# Patient Record
Sex: Male | Born: 1965 | Race: White | Hispanic: No | Smoking: Never smoker
Health system: Southern US, Community
[De-identification: ages and names within clinical notes are randomized; demographics above are authoritative.]

## PROBLEM LIST (undated history)

## (undated) DIAGNOSIS — Z8249 Family history of ischemic heart disease and other diseases of the circulatory system: Secondary | ICD-10-CM

## (undated) DIAGNOSIS — I639 Cerebral infarction, unspecified: Secondary | ICD-10-CM

## (undated) DIAGNOSIS — E669 Obesity, unspecified: Secondary | ICD-10-CM

## (undated) DIAGNOSIS — E1165 Type 2 diabetes mellitus with hyperglycemia: Secondary | ICD-10-CM

## (undated) DIAGNOSIS — I10 Essential (primary) hypertension: Secondary | ICD-10-CM

## (undated) DIAGNOSIS — E1159 Type 2 diabetes mellitus with other circulatory complications: Secondary | ICD-10-CM

## (undated) DIAGNOSIS — N184 Chronic kidney disease, stage 4 (severe): Secondary | ICD-10-CM

## (undated) DIAGNOSIS — I482 Chronic atrial fibrillation, unspecified: Secondary | ICD-10-CM

## (undated) DIAGNOSIS — I2119 ST elevation (STEMI) myocardial infarction involving other coronary artery of inferior wall: Secondary | ICD-10-CM

## (undated) DIAGNOSIS — R600 Localized edema: Secondary | ICD-10-CM

## (undated) DIAGNOSIS — I2510 Atherosclerotic heart disease of native coronary artery without angina pectoris: Secondary | ICD-10-CM

## (undated) DIAGNOSIS — Z9861 Coronary angioplasty status: Secondary | ICD-10-CM

## (undated) DIAGNOSIS — G4733 Obstructive sleep apnea (adult) (pediatric): Secondary | ICD-10-CM

## (undated) DIAGNOSIS — E785 Hyperlipidemia, unspecified: Secondary | ICD-10-CM

## (undated) HISTORY — DX: Chronic atrial fibrillation, unspecified: I48.20

## (undated) HISTORY — DX: Localized edema: R60.0

## (undated) HISTORY — DX: Hyperlipidemia, unspecified: E78.5

## (undated) HISTORY — DX: ST elevation (STEMI) myocardial infarction involving other coronary artery of inferior wall: I21.19

## (undated) HISTORY — DX: Family history of ischemic heart disease and other diseases of the circulatory system: Z82.49

## (undated) HISTORY — DX: Atherosclerotic heart disease of native coronary artery without angina pectoris: I25.10

## (undated) HISTORY — DX: Type 2 diabetes mellitus with hyperglycemia: E11.65

## (undated) HISTORY — DX: Coronary angioplasty status: Z98.61

## (undated) HISTORY — DX: Essential (primary) hypertension: I10

## (undated) HISTORY — DX: Type 2 diabetes mellitus with other circulatory complications: E11.59

---

## 2005-06-17 LAB — CBC AND DIFFERENTIAL: WBC: 8.2 10^3/mL

## 2005-06-17 LAB — BASIC METABOLIC PANEL
Creatinine: 1.8 mg/dL — AB (ref 0.6–1.3)
Glucose: 100 mg/dL
Sodium: 141 mmol/L (ref 137–147)

## 2014-04-06 DIAGNOSIS — I2119 ST elevation (STEMI) myocardial infarction involving other coronary artery of inferior wall: Secondary | ICD-10-CM

## 2014-04-06 DIAGNOSIS — Z9861 Coronary angioplasty status: Secondary | ICD-10-CM

## 2014-04-06 DIAGNOSIS — I2510 Atherosclerotic heart disease of native coronary artery without angina pectoris: Secondary | ICD-10-CM

## 2014-04-06 HISTORY — DX: Atherosclerotic heart disease of native coronary artery without angina pectoris: I25.10

## 2014-04-06 HISTORY — PX: TRANSTHORACIC ECHOCARDIOGRAM: SHX275

## 2014-04-06 HISTORY — DX: ST elevation (STEMI) myocardial infarction involving other coronary artery of inferior wall: I21.19

## 2014-04-06 HISTORY — DX: Coronary angioplasty status: Z98.61

## 2014-04-24 ENCOUNTER — Encounter: Payer: Self-pay | Admitting: Physician Assistant

## 2014-04-24 ENCOUNTER — Inpatient Hospital Stay (HOSPITAL_COMMUNITY)
Admission: EM | Admit: 2014-04-24 | Discharge: 2014-04-26 | DRG: 248 | Disposition: A | Discharge status: Home / Self Care | Payer: Medicare Other | Source: Other Acute Inpatient Hospital | Attending: Cardiology | Admitting: Cardiology

## 2014-04-24 ENCOUNTER — Other Ambulatory Visit: Payer: Self-pay

## 2014-04-24 ENCOUNTER — Encounter (HOSPITAL_COMMUNITY)
Admission: EM | Disposition: A | Discharge status: Home / Self Care | Payer: Medicare Other | Source: Other Acute Inpatient Hospital | Attending: Cardiology

## 2014-04-24 ENCOUNTER — Emergency Department: Payer: Self-pay | Admitting: Internal Medicine

## 2014-04-24 DIAGNOSIS — I2119 ST elevation (STEMI) myocardial infarction involving other coronary artery of inferior wall: Secondary | ICD-10-CM | POA: Diagnosis present

## 2014-04-24 DIAGNOSIS — I129 Hypertensive chronic kidney disease with stage 1 through stage 4 chronic kidney disease, or unspecified chronic kidney disease: Secondary | ICD-10-CM | POA: Diagnosis present

## 2014-04-24 DIAGNOSIS — E1310 Other specified diabetes mellitus with ketoacidosis without coma: Secondary | ICD-10-CM

## 2014-04-24 DIAGNOSIS — Z794 Long term (current) use of insulin: Secondary | ICD-10-CM | POA: Diagnosis not present

## 2014-04-24 DIAGNOSIS — I10 Essential (primary) hypertension: Secondary | ICD-10-CM | POA: Diagnosis present

## 2014-04-24 DIAGNOSIS — Z7901 Long term (current) use of anticoagulants: Secondary | ICD-10-CM | POA: Diagnosis not present

## 2014-04-24 DIAGNOSIS — Z8249 Family history of ischemic heart disease and other diseases of the circulatory system: Secondary | ICD-10-CM | POA: Diagnosis not present

## 2014-04-24 DIAGNOSIS — E872 Acidosis: Secondary | ICD-10-CM | POA: Diagnosis present

## 2014-04-24 DIAGNOSIS — I872 Venous insufficiency (chronic) (peripheral): Secondary | ICD-10-CM | POA: Diagnosis present

## 2014-04-24 DIAGNOSIS — Z885 Allergy status to narcotic agent status: Secondary | ICD-10-CM

## 2014-04-24 DIAGNOSIS — N179 Acute kidney failure, unspecified: Secondary | ICD-10-CM | POA: Diagnosis not present

## 2014-04-24 DIAGNOSIS — I482 Chronic atrial fibrillation, unspecified: Secondary | ICD-10-CM | POA: Diagnosis present

## 2014-04-24 DIAGNOSIS — Z9861 Coronary angioplasty status: Secondary | ICD-10-CM

## 2014-04-24 DIAGNOSIS — E785 Hyperlipidemia, unspecified: Secondary | ICD-10-CM | POA: Diagnosis present

## 2014-04-24 DIAGNOSIS — Z8673 Personal history of transient ischemic attack (TIA), and cerebral infarction without residual deficits: Secondary | ICD-10-CM

## 2014-04-24 DIAGNOSIS — Z6841 Body Mass Index (BMI) 40.0 and over, adult: Secondary | ICD-10-CM | POA: Diagnosis not present

## 2014-04-24 DIAGNOSIS — E1165 Type 2 diabetes mellitus with hyperglycemia: Secondary | ICD-10-CM | POA: Diagnosis present

## 2014-04-24 DIAGNOSIS — I2510 Atherosclerotic heart disease of native coronary artery without angina pectoris: Secondary | ICD-10-CM

## 2014-04-24 DIAGNOSIS — E1100 Type 2 diabetes mellitus with hyperosmolarity without nonketotic hyperglycemic-hyperosmolar coma (NKHHC): Secondary | ICD-10-CM | POA: Diagnosis present

## 2014-04-24 DIAGNOSIS — I878 Other specified disorders of veins: Secondary | ICD-10-CM | POA: Diagnosis present

## 2014-04-24 DIAGNOSIS — I69959 Hemiplegia and hemiparesis following unspecified cerebrovascular disease affecting unspecified side: Secondary | ICD-10-CM

## 2014-04-24 DIAGNOSIS — N184 Chronic kidney disease, stage 4 (severe): Secondary | ICD-10-CM | POA: Diagnosis present

## 2014-04-24 DIAGNOSIS — I2111 ST elevation (STEMI) myocardial infarction involving right coronary artery: Secondary | ICD-10-CM

## 2014-04-24 DIAGNOSIS — R600 Localized edema: Secondary | ICD-10-CM | POA: Diagnosis present

## 2014-04-24 DIAGNOSIS — E119 Type 2 diabetes mellitus without complications: Secondary | ICD-10-CM | POA: Insufficient documentation

## 2014-04-24 DIAGNOSIS — I480 Paroxysmal atrial fibrillation: Secondary | ICD-10-CM

## 2014-04-24 DIAGNOSIS — E871 Hypo-osmolality and hyponatremia: Secondary | ICD-10-CM | POA: Diagnosis present

## 2014-04-24 DIAGNOSIS — R079 Chest pain, unspecified: Secondary | ICD-10-CM | POA: Diagnosis present

## 2014-04-24 HISTORY — PX: PERCUTANEOUS CORONARY STENT INTERVENTION (PCI-S): SHX6016

## 2014-04-24 HISTORY — DX: Family history of ischemic heart disease and other diseases of the circulatory system: Z82.49

## 2014-04-24 HISTORY — DX: Obstructive sleep apnea (adult) (pediatric): G47.33

## 2014-04-24 HISTORY — DX: Localized edema: R60.0

## 2014-04-24 HISTORY — DX: Chronic kidney disease, stage 4 (severe): N18.4

## 2014-04-24 HISTORY — DX: Obesity, unspecified: E66.9

## 2014-04-24 HISTORY — DX: Cerebral infarction, unspecified: I63.9

## 2014-04-24 HISTORY — PX: LEFT HEART CATHETERIZATION WITH CORONARY ANGIOGRAM: SHX5451

## 2014-04-24 LAB — BASIC METABOLIC PANEL
Anion Gap: 11 (ref 7–16)
BUN: 35 mg/dL — ABNORMAL HIGH (ref 7–18)
Calcium, Total: 8 mg/dL — ABNORMAL LOW (ref 8.5–10.1)
Chloride: 96 mmol/L — ABNORMAL LOW (ref 98–107)
Co2: 22 mmol/L (ref 21–32)
Creatinine: 2.72 mg/dL — ABNORMAL HIGH (ref 0.60–1.30)
EGFR (African American): 33 — ABNORMAL LOW
EGFR (Non-African Amer.): 27 — ABNORMAL LOW
Glucose: 689 mg/dL (ref 65–99)
Osmolality: 300 (ref 275–301)
Potassium: 4.1 mmol/L (ref 3.5–5.1)
Sodium: 129 mmol/L — ABNORMAL LOW (ref 136–145)

## 2014-04-24 LAB — COMPREHENSIVE METABOLIC PANEL
ALT: 14 U/L (ref 0–53)
AST: 13 U/L (ref 0–37)
Albumin: 2.9 g/dL — ABNORMAL LOW (ref 3.5–5.2)
Alkaline Phosphatase: 205 U/L — ABNORMAL HIGH (ref 39–117)
Anion gap: 8 (ref 5–15)
BUN: 42 mg/dL — ABNORMAL HIGH (ref 6–23)
CO2: 25 mmol/L (ref 19–32)
Calcium: 8.4 mg/dL (ref 8.4–10.5)
Chloride: 94 mEq/L — ABNORMAL LOW (ref 96–112)
Creatinine, Ser: 2.56 mg/dL — ABNORMAL HIGH (ref 0.50–1.35)
GFR calc Af Amer: 33 mL/min — ABNORMAL LOW (ref >90)
GFR calc non Af Amer: 28 mL/min — ABNORMAL LOW (ref >90)
Glucose, Bld: 710 mg/dL (ref 70–99)
Potassium: 4.2 mmol/L (ref 3.5–5.1)
Sodium: 127 mmol/L — ABNORMAL LOW (ref 135–145)
Total Bilirubin: 0.7 mg/dL (ref 0.3–1.2)
Total Protein: 6.6 g/dL (ref 6.0–8.3)

## 2014-04-24 LAB — CK TOTAL AND CKMB (NOT AT ARMC)
CK, MB: 3.8 ng/mL (ref 0.3–4.0)
CK, Total: 66 U/L (ref 39–308)
CK-MB: 2.2 ng/mL (ref 0.5–3.6)
Relative Index: INVALID (ref 0.0–2.5)
Total CK: 68 U/L (ref 7–232)

## 2014-04-24 LAB — GLUCOSE, CAPILLARY
Glucose-Capillary: >600 mg/dL (ref 70–99)
Glucose-Capillary: 559 mg/dL (ref 70–99)
Glucose-Capillary: 578 mg/dL (ref 70–99)
Glucose-Capillary: 493 mg/dL — ABNORMAL HIGH (ref 70–99)
Glucose-Capillary: 497 mg/dL — ABNORMAL HIGH (ref 70–99)

## 2014-04-24 LAB — APTT
Activated PTT: 29.8 secs (ref 23.6–35.9)
aPTT: 37 seconds (ref 24–37)

## 2014-04-24 LAB — CBC
HCT: 39.5 % (ref 39.0–52.0)
Hemoglobin: 13 g/dL (ref 13.0–17.0)
MCH: 28.1 pg (ref 26.0–34.0)
MCHC: 32.9 g/dL (ref 30.0–36.0)
MCV: 85.3 fL (ref 78.0–100.0)
Platelets: 181 K/uL (ref 150–400)
RBC: 4.63 MIL/uL (ref 4.22–5.81)
RDW: 14.3 % (ref 11.5–15.5)
WBC: 11.6 K/uL — ABNORMAL HIGH (ref 4.0–10.5)

## 2014-04-24 LAB — CBC WITH DIFFERENTIAL/PLATELET
Basophil #: 0.1 10*3/uL (ref 0.0–0.1)
Basophil %: 0.9 %
Eosinophil #: 0.1 10*3/uL (ref 0.0–0.7)
Eosinophil %: 0.9 %
HCT: 41.8 % (ref 40.0–52.0)
HGB: 12.9 g/dL — ABNORMAL LOW (ref 13.0–18.0)
Lymphocyte #: 1 10*3/uL (ref 1.0–3.6)
Lymphocyte %: 7.6 %
MCH: 27.6 pg (ref 26.0–34.0)
MCHC: 31 g/dL — ABNORMAL LOW (ref 32.0–36.0)
MCV: 89 fL (ref 80–100)
Monocyte #: 0.9 x10 3/mm (ref 0.2–1.0)
Monocyte %: 6.8 %
Neutrophil #: 10.9 10*3/uL — ABNORMAL HIGH (ref 1.4–6.5)
Neutrophil %: 83.8 %
Platelet: 173 10*3/uL (ref 150–440)
RBC: 4.68 10*6/uL (ref 4.40–5.90)
RDW: 15.4 % — ABNORMAL HIGH (ref 11.5–14.5)
WBC: 13 10*3/uL — ABNORMAL HIGH (ref 3.8–10.6)

## 2014-04-24 LAB — LIPID PANEL
Cholesterol: 180 mg/dL (ref 0–200)
HDL: 30 mg/dL — ABNORMAL LOW (ref >39)
LDL Cholesterol: 112 mg/dL — ABNORMAL HIGH (ref 0–99)
Total CHOL/HDL Ratio: 6 RATIO
Triglycerides: 191 mg/dL — ABNORMAL HIGH (ref <150)
VLDL: 38 mg/dL (ref 0–40)

## 2014-04-24 LAB — PROTIME-INR
INR: 1
INR: 1.07 (ref 0.00–1.49)
Prothrombin Time: 13.1 secs (ref 11.5–14.7)
Prothrombin Time: 14 s (ref 11.6–15.2)

## 2014-04-24 LAB — POCT ACTIVATED CLOTTING TIME: Activated Clotting Time: 607 s

## 2014-04-24 LAB — MRSA PCR SCREENING: MRSA by PCR: POSITIVE — AB

## 2014-04-24 LAB — TROPONIN I
Troponin I: 0.32 ng/mL — ABNORMAL HIGH
Troponin-I: 0.46 ng/mL — ABNORMAL HIGH

## 2014-04-24 SURGERY — LEFT HEART CATHETERIZATION WITH CORONARY ANGIOGRAM

## 2014-04-24 MED ORDER — SODIUM CHLORIDE 0.9 % IJ SOLN
3.0000 mL | Freq: Two times a day (BID) | INTRAMUSCULAR | Status: DC
  Administered 2014-04-24 – 2014-04-26 (×4): 3 mL via INTRAVENOUS

## 2014-04-24 MED ORDER — SODIUM CHLORIDE 0.9 % IV SOLN: 250.0000 mL | INTRAVENOUS | Status: DC | PRN

## 2014-04-24 MED ORDER — METOPROLOL TARTRATE 25 MG PO TABS
25.0000 mg | ORAL_TABLET | Freq: Two times a day (BID) | ORAL | Status: DC
  Administered 2014-04-24: 25 mg via ORAL
  Filled 2014-04-24 (×3): qty 1

## 2014-04-24 MED ORDER — SODIUM CHLORIDE 0.9 % IV SOLN
INTRAVENOUS | Status: DC
  Administered 2014-04-24: 19:00:00 via INTRAVENOUS

## 2014-04-24 MED ORDER — INSULIN ASPART 100 UNIT/ML ~~LOC~~ SOLN
SUBCUTANEOUS | Status: AC
  Filled 2014-04-24: qty 1

## 2014-04-24 MED ORDER — CLOPIDOGREL BISULFATE 75 MG PO TABS
75.0000 mg | ORAL_TABLET | Freq: Every day | ORAL | Status: DC
  Administered 2014-04-25 – 2014-04-26 (×2): 75 mg via ORAL
  Filled 2014-04-24 (×3): qty 1

## 2014-04-24 MED ORDER — LIRAGLUTIDE 18 MG/3ML ~~LOC~~ SOPN: 1.8000 mg | PEN_INJECTOR | Freq: Every day | SUBCUTANEOUS | Status: DC

## 2014-04-24 MED ORDER — SODIUM CHLORIDE 0.9 % IV SOLN
0.2500 mg/kg/h | INTRAVENOUS | Status: DC
  Filled 2014-04-24: qty 250

## 2014-04-24 MED ORDER — MORPHINE SULFATE 2 MG/ML IJ SOLN: 2.0000 mg | INTRAMUSCULAR | Status: DC | PRN

## 2014-04-24 MED ORDER — HYDRALAZINE HCL 50 MG PO TABS
100.0000 mg | ORAL_TABLET | Freq: Two times a day (BID) | ORAL | Status: DC
  Administered 2014-04-24 – 2014-04-26 (×4): 100 mg via ORAL
  Filled 2014-04-24 (×4): qty 2

## 2014-04-24 MED ORDER — INSULIN ASPART 100 UNIT/ML ~~LOC~~ SOLN
0.0000 [IU] | Freq: Three times a day (TID) | SUBCUTANEOUS | Status: DC
Start: 2014-04-24 — End: 2014-04-24

## 2014-04-24 MED ORDER — FUROSEMIDE 80 MG PO TABS
80.0000 mg | ORAL_TABLET | Freq: Every day | ORAL | Status: DC
  Administered 2014-04-25 – 2014-04-26 (×2): 80 mg via ORAL
  Filled 2014-04-24 (×2): qty 1

## 2014-04-24 MED ORDER — ONDANSETRON HCL 4 MG/2ML IJ SOLN: 4.0000 mg | Freq: Four times a day (QID) | INTRAMUSCULAR | Status: DC | PRN

## 2014-04-24 MED ORDER — WARFARIN SODIUM 10 MG PO TABS
10.0000 mg | ORAL_TABLET | Freq: Every day | ORAL | Status: DC
  Filled 2014-04-24: qty 1

## 2014-04-24 MED ORDER — INSULIN ASPART 100 UNIT/ML ~~LOC~~ SOLN
0.0000 [IU] | SUBCUTANEOUS | Status: DC
  Administered 2014-04-24 – 2014-04-25 (×2): 20 [IU] via SUBCUTANEOUS
  Administered 2014-04-25 (×2): 7 [IU] via SUBCUTANEOUS

## 2014-04-24 MED ORDER — SODIUM CHLORIDE 0.9 % IJ SOLN: 3.0000 mL | INTRAMUSCULAR | Status: DC | PRN

## 2014-04-24 MED ORDER — GLIMEPIRIDE 4 MG PO TABS
4.0000 mg | ORAL_TABLET | Freq: Two times a day (BID) | ORAL | Status: DC
  Administered 2014-04-25: 4 mg via ORAL
  Filled 2014-04-24 (×3): qty 1

## 2014-04-24 MED ORDER — SPIRONOLACTONE 25 MG PO TABS
25.0000 mg | ORAL_TABLET | Freq: Every day | ORAL | Status: DC
  Administered 2014-04-25 – 2014-04-26 (×2): 25 mg via ORAL
  Filled 2014-04-24 (×2): qty 1

## 2014-04-24 MED ORDER — INSULIN GLARGINE 100 UNIT/ML ~~LOC~~ SOLN
25.0000 [IU] | Freq: Every day | SUBCUTANEOUS | Status: DC
  Administered 2014-04-24: 25 [IU] via SUBCUTANEOUS
  Filled 2014-04-24 (×2): qty 0.25

## 2014-04-24 MED ORDER — CHLORHEXIDINE GLUCONATE CLOTH 2 % EX PADS
6.0000 | MEDICATED_PAD | Freq: Every day | CUTANEOUS | Status: DC
Start: 2014-04-25 — End: 2014-04-26
  Administered 2014-04-25 – 2014-04-26 (×2): 6 via TOPICAL

## 2014-04-24 MED ORDER — ATORVASTATIN CALCIUM 80 MG PO TABS: 80.0000 mg | ORAL_TABLET | Freq: Every day | ORAL | Status: DC

## 2014-04-24 MED ORDER — HEPARIN SODIUM (PORCINE) 5000 UNIT/ML IJ SOLN
5000.0000 [IU] | Freq: Three times a day (TID) | INTRAMUSCULAR | Status: DC
  Administered 2014-04-25 – 2014-04-26 (×4): 5000 [IU] via SUBCUTANEOUS
  Filled 2014-04-24 (×4): qty 1

## 2014-04-24 MED ORDER — CITALOPRAM HYDROBROMIDE 20 MG PO TABS
40.0000 mg | ORAL_TABLET | Freq: Every day | ORAL | Status: DC
  Administered 2014-04-25 – 2014-04-26 (×2): 40 mg via ORAL
  Filled 2014-04-24: qty 2
  Filled 2014-04-24: qty 1

## 2014-04-24 MED ORDER — ATORVASTATIN CALCIUM 80 MG PO TABS
80.0000 mg | ORAL_TABLET | Freq: Every day | ORAL | Status: DC
  Administered 2014-04-25: 80 mg via ORAL
  Filled 2014-04-24 (×2): qty 1

## 2014-04-24 MED ORDER — LABETALOL HCL 200 MG PO TABS
200.0000 mg | ORAL_TABLET | Freq: Two times a day (BID) | ORAL | Status: DC
Start: 2014-04-24 — End: 2014-04-25
  Administered 2014-04-24: 200 mg via ORAL
  Filled 2014-04-24 (×3): qty 1

## 2014-04-24 MED ORDER — EZETIMIBE 10 MG PO TABS
10.0000 mg | ORAL_TABLET | Freq: Every day | ORAL | Status: DC
  Administered 2014-04-25 – 2014-04-26 (×2): 10 mg via ORAL
  Filled 2014-04-24 (×2): qty 1

## 2014-04-24 MED ORDER — MUPIROCIN 2 % EX OINT
1.0000 "application " | TOPICAL_OINTMENT | Freq: Two times a day (BID) | CUTANEOUS | Status: DC
  Administered 2014-04-24 – 2014-04-26 (×4): 1 via NASAL
  Filled 2014-04-24 (×2): qty 22

## 2014-04-24 MED ORDER — HEPARIN SODIUM (PORCINE) 5000 UNIT/ML IJ SOLN: 5000.0000 [IU] | Freq: Three times a day (TID) | INTRAMUSCULAR | Status: DC

## 2014-04-24 MED ORDER — ACETAMINOPHEN 325 MG PO TABS: 650.0000 mg | ORAL_TABLET | ORAL | Status: DC | PRN

## 2014-04-24 MED ORDER — ALLOPURINOL 300 MG PO TABS
300.0000 mg | ORAL_TABLET | Freq: Every day | ORAL | Status: DC
  Administered 2014-04-25 – 2014-04-26 (×2): 300 mg via ORAL
  Filled 2014-04-24 (×2): qty 1

## 2014-04-24 MED ORDER — WARFARIN - PHYSICIAN DOSING INPATIENT
Freq: Every day | Status: DC
  Administered 2014-04-25: 18:00:00

## 2014-04-24 NOTE — Interval H&P Note (Signed)
History and Physical Interval Note:  04/24/2014 5:45 PM  Chad Baker  has presented today for surgery, with the diagnosis of Inferior STEMI.  The various methods of treatment have been discussed with the patient and family. After consideration of risks, benefits and other options for treatment, the patient has consented to  Procedure(s): LEFT HEART CATHETERIZATION WITH CORONARY ANGIOGRAM (N/A) as a surgical intervention .  The patient's history has been reviewed, patient examined, no change in status, stable for surgery.  I have reviewed the patient's chart and labs.  Questions were answered to the patient's satisfaction.     HARDING, DAVID W

## 2014-04-24 NOTE — Progress Notes (Signed)
eLink Physician-Brief Progress Note Patient Name: Chad GaveMel Rainford DOB: [birth year] MRN: [id number]   Date of Service  04/24/2014  HPI/Events of Note  [age] M admitted directly from cardiac cath lab with dx of STEMI and PMH of CAF, Htn, DM, CKD, obesity. He is in no distress and vital signs are stable  eICU Interventions  Care plan reviewed.  Usual eLink monitoring will be provided     Intervention Category Evaluation Type: New Patient Evaluation  Billy FischerDavid Simonds 04/24/2014, 6:57 PM

## 2014-04-24 NOTE — H&P (Signed)
History and Physical  Patient ID: Chad Baker MRN: [id number], DOB: 12-13-1965 Date of Encounter: 04/24/2014, 3:26 PM Primary Physician: No primary care provider on file. Primary Cardiologist: New to the area - has not seen cardiologist in quite a while (originally from [place])  Chief Complaint: chest pain Reason for Admission: STEMI  HPI: Chad Baker is a 49 y/o M with history of morbid obesity, CVA 2000 (?hemorrhagic per patient), HTN, HLD, CKD (unknown baseline), DM, OSA, AF who presented to [facility] upon transfer from [facility] for STEMI. He moved here from [place] recently but said he has not seen a cardiologist for quite a while. He says he has a history of prior DCCV but since then has remained in AF chronically. His primary care doctor in [place] was following his INR but he has not had this checked since November (1.0 today). He was in his usual state of heath this morning, however, began feeling "terrible" around 11am. He said he had a BM all over himself and from there began having chest pain with nausea and mild diaphoresis. He was watching TV at the time. CP went to his jaw and he felt lightheaded and SOB. He took his regularly scheduled meds (unknown at this time) but did not feel better so called EMS. He was taken to [facility] where EKG demonstrated acute inferior ST elevation. Glucose >600, Cr 2.72, INR 1.0. He was subsequently transferred to [facility] for further evaluation. He received  ASA, 5000 units of heparin, 10 unts of insulin, 1 SL NTG and IV fluids (1.5 L per triage nurse). EMS states BP was 90/40 with HR 70s when they initially picked him up. Most recent vitals show BP 120/76, HR 70s. He says he feels much better right now but still notes 2/10 chest discomfort.   Past Medical History  Diagnosis Date  . Atrial fibrillation   . Stroke     a. 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  Marland Kitchen HTN (hypertension)   . Hyperlipidemia   . CKD (chronic kidney  disease)   . Diabetes mellitus   . OSA (obstructive sleep apnea)   . Morbid obesity      Most Recent Cardiac Studies: None   Surgical History: Prior DCCV per patient.  Home Meds: Unknown at this time. Patient said he asked EMS to grab them but it's unclear if they made the trip.  Allergies:  Allergies  Allergen Reactions  . Codeine Nausea And Vomiting    History   Social History  . Marital Status: Single    Spouse Name: N/A    Number of Children: N/A  . Years of Education: N/A   Occupational History  . Not on file.   Social History Main Topics  . Smoking status: Never Smoker   . Smokeless tobacco: Not on file  . Alcohol Use: No  . Drug Use: No  . Sexual Activity: Not on file   Other Topics Concern  . Not on file   Social History Narrative   Originally from [place], moved to [place]. Has girlfriend in [place].     Family History  Problem Relation Age of Onset  . Heart disease Father   . Heart disease Brother     Review of Systems: Chronic LEE with venous stasis changes. All other systems reviewed and are otherwise negative except as noted above.  Labs: WBC 13.0, Hgb 12.9, Hct 41.8, Plt 173 PT 13.1, INR 1.0 APTT 29.8  BUN 36, Cr 2.72, Na 129, K 4.10, Cl 96, CO2 22, Calcium 8.0, anion gap 11 POCT glucose >600  Radiology/Studies:  Our [facility] team read the report off - CXR - "Cardiac enlargement with mild vascular congestion, interstitial edema, no focal infiltrate. Findings c/w mild CHF."   EKG:  Initial EMS EKG with AF 57bpm, nonspecific ST-T changes with slight upsloping in lead III Subsequent trac[facility] ED: atrial fibrillation with rates 89-97 conerning for inferior ST Elevation up to II mm in lead III, less pronounced but still present in II, avF, with reciprocal downsloping TWI/ST depression I, avL  Physical Exam: BP 120/76, HR 76, POx 99% 2L, RR 12 General: Well developed morbidly obese WM, in no acute distress. Head:  Normocephalic, atraumatic, sclera non-icteric, no xanthomas, nares are without discharge.  Neck: Negative for carotid bruits.  Lungs: Clear bilaterally to auscultation without wheezes, rales, or rhonchi. Breathing is unlabored. Heart: RRR with S1 S2. No murmurs, rubs, or gallops appreciated. Abdomen: Soft, obese and rounded with normoactive bowel sounds. No hepatomegaly. No rebound/guarding. No obvious abdominal masses. Msk:  Strength and tone appear normal for age. Extremities: 2+ LEE and venous stasis discoloration which patient states is chronic. Pedal pulses difficult to palpate given habitus. Neuro: Alert and oriented X 3. No focal deficit. No facial asymmetry. Moves all extremities spontaneously. Psych:  Responds to questions appropriately with a normal affect.    ASSESSMENT AND PLAN:   1. Acute inferior STEMI 2. Chronic atrial fibrillation, supposed to be on Coumadin PTA 3. H/o stroke (possibly hemorrhagic) in 2010 4. Essential HTN 5. Hyperlipidemia 6. Diabetes mellitus with marked hyperglycemia and subsequent pseudohyponatremia 7. CKD, baseline Cr unknown - [facility] lists "stage IV" but patient unsure 8. Morbid obesity  The patient was brought emergently to the cardiac cath lab where he will undergo emergent cardiac catheterization. Further management dependent on anatomy. Cath lab team aware of AKI and history of stroke. Given history of possible hemorrhagic CVA (based on patient's report) he would likely only be a candidate for Plavix. We will also need to determine timing of resumption of anticoagulation since CHADSVASC = at least 4 for HTN/DM/CVA, possibly 5 if he does have vascular disease, and EF is also currently unknown. Appreciate pharmacy's assistance with home med rec. Per d/w MD, Dr. Herbie BaltimoreHarding plans to place admit orders once cath is complete and plan is known. Tentatively I will place SSI orders but have asked IM to please assist with marked hyperglycemia. The hospitalist has  asked me to write a care order asking the nurse to call the internal medicine flow manager at [phone] when the patient arrives to their CCU bed so that they can come and see him. I have done this. Appreciate their assistance.  Signed, Dayna Dunn PA-C 04/24/2014, 3:26 PM   I saw and examined the patient in the cardiac catheterization lab. He presented to [facility] room following chest pain onset this morning after large BM. He was just not feeling well and started having pain. In the ER was noted to have significant ST elevations in inferior leads in the baseline atrial fibrillation. He had 1-2 mm ablation in II, III, aVF with depressions in I and aVL.  He was also noted to have a blood glucose level of greater than 650. He is morbidly obese and was not able to be done at the [facility]. Therefore he was dosed with IV heparin and insulin and transferred here for urgent catheterization. Upon  arrival he was a 3 out of 10 chest pain, therefore we've decided to proceed with emergent cardiac catheterization. Was also noted to have elevated creatinine of greater than 2 with known chronic renal insufficiency. Unfortunately we don't know what his actual baseline is.  No LV Gram will be performed in order to conserve contrast to protect renal function.  Following catheterization, he will need medicine consultation to assist with glycemic control. He is on warfarin long term, therefore if possible would try to use bare-metal stents to avoid prolonged multiple drug therapy.  He will need to be on statin. We'll need to wait to see what his blood pressures are following his catheterization prior to initiating other therapy beyond beta blocker.  Marykay Lex, M.D., M.S. Interventional Cardiologist   Pager # [phone]

## 2014-04-24 NOTE — Progress Notes (Signed)
PHARMACIST - PHYSICIAN COMMUNICATION Drs. Diana EvesHarding and Khan CONCERNING: Pharmacy Care Issues Regarding Warfarin Labs  RECOMMENDATION (Action Taken): A daily protime has been ordered to meet the [facility] National Patient safety goal and comply with the current [facility] policy.   The Pharmacy will defer all warfarin dose order changes and follow up of lab results to the prescriber unless an additional order to initiate a "pharmacy Coumadin consult" is placed.  DESCRIPTION:  While hospitalized, to be in compliance with The Joint Commission National Patient Safety Goals, all patients on warfarin must have a baseline and/or current protime prior to the administration of warfarin. Pharmacy has received your order for warfarin without these required laboratory assessments.   Nicolette Bangheresa Dianah Pruett, RPh Pager: [phone] 04/24/2014 9:50 PM

## 2014-04-24 NOTE — CV Procedure (Signed)
CARDIAC CATHETERIZATION AND PERCUTANEOUS CORONARY INTERVENTION REPORT  NAME:  Chad Baker   MRN: [id number] DOB:  07/10/1965   ADMIT DATE: 04/24/2014 Procedure Date: 04/24/2014  INTERVENTIONAL CARDIOLOGIST: Leonie Man, M.D., MS PRIMARY CARE PROVIDER: No primary care provider on file. PRIMARY CARDIOLOGIST:  New to    PATIENT:  Chad Baker is a 49 y.o. male with severe morbid obesity, prior CVA (with hemorrhagic component) in 2000, hypertension, hyperlipidemia, chronic renal insufficiency with unknown baseline, type 2 diabetes mellitus poorly controlled, OSA, chronic atrial fibrillation supposedly on warfarin. He was transferred from [facility] or inferior STEMI after presenting to the emergency room there and found to have inferior ST elevations in leads II, III, aVF with appropriate reciprocal changes. He was also noted to be hyperglycemic with a blood sugar of greater than 600. Creatinine was 2.72. Subtherapeutic INR 1.0. Due to his morbid obesity, he was too heavy for the [facility] cardiac catheterization lab, therefore he was transferred to [facility] for urgent cardiac catheterization.  PRE-OPERATIVE DIAGNOSIS:    Inferior STEMI  Extensive cardiac risk factors as indicated above.  PROCEDURES PERFORMED:    Left Heart Catheterization with Native Coronary Angiography  via Right Radial Artery   PTCA of proximal RCA 100% occlusion  Aspiration Thrombectomy of proximal to distal RCA  Complex, difficult 2 site Bare-Metal Stent PCI to the distal and proximal RCA  PROCEDURE: The patient was brought to the 2nd [facility]iac Catheterization Lab in the fasting state and prepped and draped in the usual sterile fashion for right radial artery access. A modified Allen's test was performed on the right wrist demonstrating excellent collateral flow for radial access.   Sterile technique was used including antiseptics, cap, gloves, gown, hand hygiene, mask and sheet.  Skin prep: Chlorhexidine.   Consent: Risks of procedure as well as the alternatives and risks of each were explained to the (patient/caregiver). Consent for procedure obtained.   Time Out: Verified patient identification, verified procedure, site/side was marked, verified correct patient position, special equipment/implants available, medications/allergies/relevent history reviewed, required imaging and test results available. Performed.  Access:   Right Radial Artery: 6 Fr Sheath -  Seldinger Technique (Angiocath Micropuncture Kit)  Radial Cocktail - 10 mL; IV Angiomax bolus and drip   Left Heart Catheterization: 5 and 6 Fr Catheters advanced or exchanged over a long exchange safety J-wire; TIG 4.0 catheter advanced first.  Left Coronary Artery Cineangiography: TIG 4.0 Catheter  Right Coronary Artery Cineangiography: 6 Fr JR4 Guide Catheter   LV Hemodynamics: Angled Pigtail, Post PCI  Sheath removed in the cardiac catheterization lab with TR band placement for hemostasis.  TR Band: 1730  Hours; 17 mL air  FINDINGS:  Hemodynamics:   Central Aortic Pressure / Mean: 135/95/110 mmHg  Left Ventricular Pressure / LVEDP: 137/13/23 mmHg  Left Ventriculography: Not performed  Coronary Anatomy:  Dominance: Right  Left Main: Very Large-caliber vessel that trifurcates into the LAD, Ramus Intermedius, Circumflex. Angiographic normal. LAD: Normal caliber vessel with diffuse mild tender 20% lesions. Vessel courses down around the apex perfusing the distal one third of the inferoapex.  D1: Moderate large-caliber proximal diagonal branch that bifurcates. It is also relatively free of disease.   Left Circumflex: Very Large-caliber vessel with a very small first marginal branch in the mid AV groove. Just prior to that there is a focal 20% stenosis. The vessel then really terminates as a large lateral OM with a very small AV groove branch. Tortuous vessel  but free of significant disease. Her main  is large caliber almost all the way to the distal end.  Ramus intermedius: Normal/large-caliber vessel that bifurcates into 2 branches, one that courses as a proximal diagonal and the another branch that courses as an obtuse marginal. No obvious lesions noted.   RCA: Extremely large caliber, dominant vessel that is occluded at the proximal segment just after the first bend. There is a bird's beak appearance. There is calcification noted in the mid vessel.  Post wire advancement revealed a large caliber vessel continuing on distally where bifurcates into the Right Posterior AV Groove Branch (RPAV) and the Right Posterior Descending Artery (RPDA). At the crux there is a focal 80% hazy lesion. This was actually identified following balloon angioplasty site where distal thromboembolism occluded the vessel.  RPDA: Moderate large-caliber tortuous vessel with minimal disease.  RPL Sysytem:The RPAV begins as a moderate-caliber vessel that really terminates as a moderate caliber posterolateral branch that appears to be occluded distally in area to far to advance wire.  After reviewing the initial angiography, the culprit lesion was thought to be 100% thrombotic lesion occluded proximal RCA.  Preparation were made to proceed with PCI on this lesion.  Percutaneous Coronary Intervention:  100% thrombotic proximal RCA occlusion with distal embolization - initial TIMI 0 flow with TIMI-3 flow post; 2 stenoses (100% and 80%) reduced to 0%  Complex procedure secondary to tortuosity of the vessel, long size of the vessel requiring cineangiography for simple visualization. All wires were used including the GuideLiner catheter Guide: 6 Fr   JR4  Guidewire: Initial wire was a [employer] -- there was reperfusion with wire advancement beyond the lesion.  After balloon angioplasty, the initial stent was unable to be deployed distally, therefore a GuideLiner catheter was used, but still the stent would not deploy a beyond the  distal lesion. At this point while removing the stent, the wire and guide liner were also removed.  The lesion was then rewired with first a BMW followed by a [employer] wire that would be used as the [employer] Balloon: Emerge 3.0 mm x 15 mm; over the initial BMW wire  12 Atm x 30 Sec,   Following balloon inflation, there appeared to be distal embolization of thrombus that been occluded the distal lesion, the decision was made to then advance and aspiration thymectomy catheter.  Pronto V4 Thrombectomy Catheter - one pass that successfully aspirated thrombus  Following aspiration, there was now TIMI 3 flow distally with minimal residual thrombus distally but still visible thrombus at the original occlusion site.  Next a Veriflex bare-metal 5.0 mm x 18 mm stent unsuccessfully attempted to pass down to the distal lesion.  At this point the GuideLiner was used with the 3.0 mm balloon support, the stent within only advance to within the distal lesion but not beyond it.  The stent balloon, wire and GuideLiner were then removed.  With the [facility] and BMW wires in place, a [name] BMS 4.5 mm x 20 mm stent was successfully advanced to the distal lesion  Distal RCA lesion: 70-80% stenosis at the crux - reduced to 0%  Stent: [name] BMS 4.5 mm x 20 mm;   20 Atm x 30 Sec, final diameter 4.8 mm Post-dilation Balloon: [place] 5.0 mm x 8 mm;   14 Atm x 30 Sec x 3 inflations in the distal and proximal portion of the stent;  16 Atm x 30 Sec in the mid stent at the  original lesion  Final Diameter: 5.1-2 mm  Proximal RCA lesion: 100% % stenosis at the proximal RV marginal branch- reduced to 0%  Stent: Veriflex BMS 5.0 mm x 28 mm;   14 Atm x 30 Sec,   Postdilated with stent balloon: 18 Atm x 30 Sec -- final diameter 5.8 mm Post-dilation Balloon: [employer] Emerge 6.0 mm x 8 mm;   8 Atm x 30 Sec x 2 - distal and proximal portion of stent;  10 Atm x 30 Sec in the mid portion of the stent at the  original lesion  Final Diameter: 6.0-6.2 mm  Post deployment angiography in multiple views, with and without guidewire in place revealed excellent stent deployment and lesion coverage.  There was no evidence of dissection or perforation. The very distal portion of the posterior lateral branch did appear to continue to be occluded. This is too far for wire to be advanced.  MEDICATIONS:  Anesthesia:  Local Lidocaine 2 ml  Sedation:  1 mg IV Versed, 25 mcg IV fentanyl ;   Premedication: 5000 units IV heparin, 10 units Insulin  Omnipaque Contrast: 240 ml  Anticoagulation:  IV Angiomax Bolus & drip - stopped at completion of procedure  Anti-Platelet Agent:  Brilinta 180 mg load; after the significant thrombus burden was noted, Aggrastat bolus and drip was used and stopped post PCI Radial Cocktail: 5 mg Verapamil, 400 mcg NTG, 2 ml 2% Lidocaine in 10 ml NS IV Zofran 4 mg. IV NovoLog Insulin 20 units -- CABG dropped from greater than 700 to 576  PATIENT DISPOSITION:    The patient was transferred to the PACU holding area in a hemodynamicaly stable, chest pain free condition.  The patient tolerated the procedure well, and there were no complications.  EBL:   < 20 ml  The patient was stable before, during, and after the procedure.  POST-OPERATIVE DIAGNOSIS:    Severe single-vessel CAD with thrombotic occlusion of the proximal RCA with the distal RCA lesion as well. Both sites were stent successfully treated with large bare-metal stents. TIMI-3 flow was restored  Mild disease in Left Coronary System  Moderately elevated LVEDP. Left ventricular few was not performed in order to conserve contrast.  Severe hyperglycemia - with likely hyperosmotic nonketotic acidosis  Severe morbid obesity, BMI is well over 50  Hypertension, hyperlipidemia, chronic atrial fibrillation  PLAN OF CARE:  Admit to CCU for post radial cath PCI care.  Convert from Brilinta to Plavix in the morning. Would  continue aspirin plus Plavix and restart warfarin for atrial fibrillation tomorrow.  Would be safe to stop aspirin after one month, but preferably continue Plavix to complete 1 year course.  Check 2-D echocardiogram to get a better assessment of EF  [facility] consultation to assist with glycemic control, likely insulin drip; also to assist with establishing a stable outpatient regimen.  Add statin and low-dose beta blocker for now.  The patient is chronically debilitated, will likely not be a short stay. Anticipate 4-5 days to stabilize.  I personally discussed the patient's case and findings with his mother and significant other    Ghadeer Kastelic, Leonie Green, M.D., M.S. Interventional Cardiologist   Pager # [phone]

## 2014-04-24 NOTE — Consult Note (Signed)
[facility] Initial Consult  Dickey Gave ZOX:[id number] DOB: Jun 25, 1965 DOA: 04/24/2014  Referring physician: Bryan Lemma, MD PCP: No primary care provider on file.   Chief Complaint: elevated blood sugar  HPI: Chad Baker is a 49 y.o. male referred for elevated blood sugars. Patient presented to the hospital with chest pressure pain and pain in the jaw also. He has an acute STEMI. He had been having nausea associated and was also diaphoretic. Patient states that he is a diabetic and has been poorly controlled. Patient also states he has a history of stroke in the past and is on chronic anticoagulation. Patient was taken to the cath lab and has undergone stent placement times two. Patient has been having issues with his blood sugar being out of control. The latest sugar was around 500. He states that he has been on lantus in the past and he has also been taking victoza.    Review of Systems:  Constitutional:  No weight loss, night sweats, Fevers, chills, ++fatigue.  HEENT:  No headaches, Difficulty swallowing  Cardio-vascular:  ++chest pain and pressure, ++swelling in lower extremities  GI:  No heartburn, indigestion, abdominal pain, ++nausea, no vomiting  Resp:  ++shortness of breath at rest. No coughing up of blood.No chest wall deformity  Skin:  no rash or lesions GU:  no dysuria, change in color of urine, No flank pain.  Musculoskeletal:  ++joint pain No decreased range of motion  Psych:  No change in mood or affect. No depression or anxiety   Past Medical History  Diagnosis Date  . Atrial fibrillation   . Stroke     a. 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  Marland Kitchen HTN (hypertension)   . Hyperlipidemia   . CKD (chronic kidney disease)   . Diabetes mellitus   . OSA (obstructive sleep apnea)   . Morbid obesity    No past surgical history on file. Social History:  reports that he has never smoked. He does not have any smokeless tobacco history on file.  He reports that he does not drink alcohol or use illicit drugs.  Allergies  Allergen Reactions  . Codeine Nausea And Vomiting    Family History  Problem Relation Age of Onset  . Heart disease Father   . Heart disease Brother      Prior to Admission medications   Medication Sig Start Date End Date Taking? Authorizing Provider  allopurinol (ZYLOPRIM) 300 MG tablet Take 300 mg by mouth daily.   Yes Historical Provider, MD  atorvastatin (LIPITOR) 80 MG tablet Take 80 mg by mouth daily.   Yes Historical Provider, MD  Chromium-Cinnamon (CINNAMON PLUS CHROMIUM PO) Take 500 mg by mouth 2 (two) times daily. Contains Chromium 100 mcg Two tablets twice a day   Yes Historical Provider, MD  CINNAMON PO Take 1,000 mg by mouth.   Yes Historical Provider, MD  citalopram (CELEXA) 40 MG tablet Take 40 mg by mouth daily.   Yes Historical Provider, MD  Cranberry-Vitamin C-Vitamin E 4200-20-3 MG-MG-UNIT CAPS Take 2 capsules by mouth 2 (two) times daily.    Yes Historical Provider, MD  ezetimibe (ZETIA) 10 MG tablet Take 10 mg by mouth daily.   Yes Historical Provider, MD  furosemide (LASIX) 80 MG tablet Take 80 mg by mouth daily.   Yes Historical Provider, MD  glimepiride (AMARYL) 4 MG tablet Take 4 mg by mouth 2 (two) times daily with a meal.   Yes Historical Provider,  MD  hydrALAZINE (APRESOLINE) 100 MG tablet Take 100 mg by mouth 2 (two) times daily.   Yes Historical Provider, MD  insulin glargine (LANTUS) 100 UNIT/ML injection Inject 26 Units into the skin at bedtime. Hasn't used in 2 months   Yes Historical Provider, MD  labetalol (NORMODYNE) 200 MG tablet Take 200 mg by mouth 2 (two) times daily.    Yes Historical Provider, MD  spironolactone (ALDACTONE) 25 MG tablet Take 25 mg by mouth daily.   Yes Historical Provider, MD  warfarin (COUMADIN) 10 MG tablet Take 10 mg by mouth daily at 6 PM.   Yes Historical Provider, MD   Physical Exam: Filed Vitals:   04/24/14 1500 04/24/14 1758 04/24/14 1900    Pulse:  83   Resp:  7   Height:   6' 4.5" (1.943 m)  Weight: 217.727 kg (480 lb)    SpO2:  100%     Wt Readings from Last 3 Encounters:  04/24/14 217.727 kg (480 lb)    General:  Appears calm and comfortable Eyes: PERRL, normal lids, irises & conjunctiva ENT: grossly normal hearing, lips & tongue Neck: no LAD, masses or thyromegaly Cardiovascular: RRR, no m/r/g. ++LE edema. Telemetry: SR, no arrhythmias  Respiratory: CTA bilaterally, no w/r/r. Normal respiratory effort. Abdomen: soft, ntnd Skin: chronic venous stasis Musculoskeletal: grossly normal tone BUE/BLE Psychiatric: grossly normal mood and affect, speech fluent and appropriate Neurologic: grossly non-focal.          Labs on Admission:  Basic Metabolic Panel:  Recent Labs Lab 04/24/14 1540  NA 127*  K 4.2  CL 94*  CO2 25  GLUCOSE 710*  BUN 42*  CREATININE 2.56*  CALCIUM 8.4   Liver Function Tests:  Recent Labs Lab 04/24/14 1540  AST 13  ALT 14  ALKPHOS 205*  BILITOT 0.7  PROT 6.6  ALBUMIN 2.9*   No results for input(s): LIPASE, AMYLASE in the last 168 hours. No results for input(s): AMMONIA in the last 168 hours. CBC:  Recent Labs Lab 04/24/14 1540  WBC 11.6*  HGB 13.0  HCT 39.5  MCV 85.3  PLT 181   Cardiac Enzymes:  Recent Labs Lab 04/24/14 1540  CKTOTAL 68  CKMB 3.8  TROPONINI 0.32*    BNP (last 3 results) No results for input(s): PROBNP in the last 8760 hours. CBG:  Recent Labs Lab 04/24/14 1541 04/24/14 1650 04/24/14 1720 04/24/14 1852  GLUCAP >600* 578* 559* 493*    Radiological Exams on Admission: No results found.    Assessment/Plan Principal Problem:   ST-segment elevation myocardial infarction (STEMI) of inferior wall Active Problems:   CAD S/P 2 SITE BMS PCI TO PROX & DISTAL RCA -    Chronic atrial fibrillation   Diabetes mellitus, type II, insulin dependent   Type II diabetes mellitus with hyperosmolarity, uncontrolled   Chronic venous stasis  dermatitis of both lower extremities   Bilateral lower extremity edema - severe   Essential hypertension   Family history of premature CAD   Morbid obesity with body mass index of 50 or higher   ST elevation myocardial infarction (STEMI) of inferior wall, subsequent episode of care   1. Diabtes mellitus uncontrolled -will resume home medications -resume insulin -check A1C -will start on sliding scale  2. STEMI -s/p stent placement -cardiology recs  3. Hypertension -resume home medications -monitor pressures  4. Atrial fibrillation -currently rate controlled -resume coumadin  5. Hyponatremia -related to hyperglycemia -will monitor repeat labs     Code Status:  Full Code (must indicate code status--if unknown or must be presumed, indicate so) DVT Prophylaxis:Coumadin Family Communication: Girlfriend (indicate person spoken with, if applicable, with phone number if by telephone) Disposition Plan: Home (indicate anticipated LOS)  Time spent: 65min  [facility] HospitalKHAN,Leannah Guse A [facility] Pager [phone]

## 2014-04-25 ENCOUNTER — Encounter (HOSPITAL_COMMUNITY): Payer: Self-pay | Admitting: Interventional Cardiology

## 2014-04-25 DIAGNOSIS — I2119 ST elevation (STEMI) myocardial infarction involving other coronary artery of inferior wall: Principal | ICD-10-CM

## 2014-04-25 DIAGNOSIS — I517 Cardiomegaly: Secondary | ICD-10-CM

## 2014-04-25 DIAGNOSIS — Z9861 Coronary angioplasty status: Secondary | ICD-10-CM

## 2014-04-25 LAB — GLUCOSE, CAPILLARY
Glucose-Capillary: 379 mg/dL — ABNORMAL HIGH (ref 70–99)
Glucose-Capillary: 234 mg/dL — ABNORMAL HIGH (ref 70–99)
Glucose-Capillary: 205 mg/dL — ABNORMAL HIGH (ref 70–99)
Glucose-Capillary: 158 mg/dL — ABNORMAL HIGH (ref 70–99)
Glucose-Capillary: 213 mg/dL — ABNORMAL HIGH (ref 70–99)
Glucose-Capillary: 218 mg/dL — ABNORMAL HIGH (ref 70–99)

## 2014-04-25 LAB — CBC
HCT: 37.3 % — ABNORMAL LOW (ref 39.0–52.0)
HCT: 38.2 % — ABNORMAL LOW (ref 39.0–52.0)
Hemoglobin: 12.3 g/dL — ABNORMAL LOW (ref 13.0–17.0)
Hemoglobin: 12.5 g/dL — ABNORMAL LOW (ref 13.0–17.0)
MCH: 27.8 pg (ref 26.0–34.0)
MCH: 28 pg (ref 26.0–34.0)
MCHC: 32.7 g/dL (ref 30.0–36.0)
MCHC: 33 g/dL (ref 30.0–36.0)
MCV: 84.9 fL (ref 78.0–100.0)
MCV: 85 fL (ref 78.0–100.0)
Platelets: 195 10*3/uL (ref 150–400)
Platelets: 206 10*3/uL (ref 150–400)
RBC: 4.39 MIL/uL (ref 4.22–5.81)
RBC: 4.5 MIL/uL (ref 4.22–5.81)
RDW: 14.2 % (ref 11.5–15.5)
RDW: 14.3 % (ref 11.5–15.5)
WBC: 10 10*3/uL (ref 4.0–10.5)
WBC: 10.9 10*3/uL — ABNORMAL HIGH (ref 4.0–10.5)

## 2014-04-25 LAB — PROTIME-INR
INR: 1.31 (ref 0.00–1.49)
Prothrombin Time: 16.4 seconds — ABNORMAL HIGH (ref 11.6–15.2)

## 2014-04-25 LAB — TROPONIN I
Troponin I: 43.94 ng/mL (ref <0.031)
Troponin I: 52.16 ng/mL (ref <0.031)

## 2014-04-25 LAB — HEMOGLOBIN A1C
Hgb A1c MFr Bld: 14.6 % — ABNORMAL HIGH (ref <5.7)
Hgb A1c MFr Bld: 14.8 % — ABNORMAL HIGH (ref <5.7)
Mean Plasma Glucose: 372 mg/dL — ABNORMAL HIGH (ref <117)
Mean Plasma Glucose: 378 mg/dL — ABNORMAL HIGH (ref <117)

## 2014-04-25 LAB — BASIC METABOLIC PANEL
Anion gap: 10 (ref 5–15)
BUN: 37 mg/dL — ABNORMAL HIGH (ref 6–23)
CO2: 25 mmol/L (ref 19–32)
Calcium: 8.6 mg/dL (ref 8.4–10.5)
Chloride: 101 mEq/L (ref 96–112)
Creatinine, Ser: 2.24 mg/dL — ABNORMAL HIGH (ref 0.50–1.35)
GFR calc Af Amer: 38 mL/min — ABNORMAL LOW (ref >90)
GFR calc non Af Amer: 33 mL/min — ABNORMAL LOW (ref >90)
Glucose, Bld: 194 mg/dL — ABNORMAL HIGH (ref 70–99)
Potassium: 3.7 mmol/L (ref 3.5–5.1)
Sodium: 136 mmol/L (ref 135–145)

## 2014-04-25 MED ORDER — PERFLUTREN LIPID MICROSPHERE
INTRAVENOUS | Status: AC
  Administered 2014-04-25: 2 mL
  Filled 2014-04-25: qty 10

## 2014-04-25 MED ORDER — INSULIN ASPART 100 UNIT/ML ~~LOC~~ SOLN
0.0000 [IU] | Freq: Three times a day (TID) | SUBCUTANEOUS | Status: DC
  Administered 2014-04-25: 4 [IU] via SUBCUTANEOUS
  Administered 2014-04-25: 7 [IU] via SUBCUTANEOUS
  Administered 2014-04-26: 11 [IU] via SUBCUTANEOUS
  Administered 2014-04-26: 7 [IU] via SUBCUTANEOUS

## 2014-04-25 MED ORDER — WARFARIN SODIUM 10 MG PO TABS
10.0000 mg | ORAL_TABLET | Freq: Once | ORAL | Status: AC
  Administered 2014-04-25: 10 mg via ORAL
  Filled 2014-04-25 (×2): qty 1

## 2014-04-25 MED ORDER — CHLORHEXIDINE GLUCONATE 0.12 % MT SOLN
15.0000 mL | Freq: Two times a day (BID) | OROMUCOSAL | Status: DC
  Administered 2014-04-26: 15 mL via OROMUCOSAL
  Filled 2014-04-25: qty 15

## 2014-04-25 MED ORDER — CARVEDILOL 12.5 MG PO TABS
12.5000 mg | ORAL_TABLET | Freq: Two times a day (BID) | ORAL | Status: DC
  Administered 2014-04-25 – 2014-04-26 (×3): 12.5 mg via ORAL
  Filled 2014-04-25 (×5): qty 1

## 2014-04-25 MED ORDER — ASPIRIN EC 81 MG PO TBEC
81.0000 mg | DELAYED_RELEASE_TABLET | Freq: Every day | ORAL | Status: DC
Start: 2014-04-25 — End: 2014-04-26
  Administered 2014-04-25 – 2014-04-26 (×2): 81 mg via ORAL
  Filled 2014-04-25 (×2): qty 1

## 2014-04-25 MED ORDER — INSULIN ASPART 100 UNIT/ML ~~LOC~~ SOLN
3.0000 [IU] | Freq: Three times a day (TID) | SUBCUTANEOUS | Status: DC
  Administered 2014-04-25 (×2): 3 [IU] via SUBCUTANEOUS

## 2014-04-25 MED ORDER — PERFLUTREN LIPID MICROSPHERE
1.0000 mL | INTRAVENOUS | Status: AC | PRN
  Filled 2014-04-25: qty 10

## 2014-04-25 MED ORDER — INSULIN GLARGINE 100 UNIT/ML ~~LOC~~ SOLN
32.0000 [IU] | Freq: Every day | SUBCUTANEOUS | Status: DC
  Administered 2014-04-25: 32 [IU] via SUBCUTANEOUS
  Filled 2014-04-25 (×2): qty 0.32

## 2014-04-25 MED FILL — Lidocaine HCl Local Preservative Free (PF) Inj 1%: INTRAMUSCULAR | Qty: 30 | Status: AC

## 2014-04-25 MED FILL — Nitroglycerin IV Soln 100 MCG/ML in D5W: INTRA_ARTERIAL | Qty: 10 | Status: AC

## 2014-04-25 MED FILL — Heparin Sodium (Porcine) 2 Unit/ML in Sodium Chloride 0.9%: INTRAMUSCULAR | Qty: 1500 | Status: AC

## 2014-04-25 MED FILL — Tirofiban HCl in NaCl IV Soln 25 MG/500ML (Base Equiv): INTRAVENOUS | Qty: 250 | Status: AC

## 2014-04-25 MED FILL — Bivalirudin For IV Soln 250 MG: INTRAVENOUS | Qty: 250 | Status: AC

## 2014-04-25 MED FILL — Sodium Chloride IV Soln 0.9%: INTRAVENOUS | Qty: 50 | Status: AC

## 2014-04-25 MED FILL — Ticagrelor Tab 90 MG: ORAL | Qty: 1 | Status: AC

## 2014-04-25 MED FILL — Fentanyl Citrate Inj 0.05 MG/ML: INTRAMUSCULAR | Qty: 2 | Status: AC

## 2014-04-25 MED FILL — Ondansetron HCl Inj 4 MG/2ML (2 MG/ML): INTRAMUSCULAR | Qty: 2 | Status: AC

## 2014-04-25 MED FILL — Midazolam HCl Inj 2 MG/2ML (Base Equivalent): INTRAMUSCULAR | Qty: 2 | Status: AC

## 2014-04-25 NOTE — Progress Notes (Signed)
CARDIAC REHAB PHASE I   PRE:  Rate/Rhythm: 98 afib  BP:  Supine: 102/60  Sitting:   Standing:    SaO2:   MODE:  Ambulation: 350 ft   POST:  Rate/Rhythm: 146 afib  BP:  Supine:   Sitting: 122/84  Standing:    SaO2:  1340-1437 Pt walked 350 independently with two of us walking beside with one watching monitor constantly. Heart rate up to 146 during walk. No CP. MI education begun with pt and significant other. Reviewed MI restrictions, stent, NTG use, carb counting and risk factors. Pt stated he has been instructed in carb counting in past. [place] meter at [place] so will need meter to be able to monitor sugars. Pt stated he had insulin but just did not take it. Stressed importance of taking plavix with stent. Pt denied feeling palpitations with heart rate up during walk. Will complete ed tomorrow.   Luetta Nuttingharlene Kupono Marling, RN BSN  04/25/2014 2:33 PM

## 2014-04-25 NOTE — Progress Notes (Signed)
ANTICOAGULATION CONSULT NOTE - Initial Consult  Pharmacy Consult for coumadin Indication: atrial fibrillation  Allergies  Allergen Reactions  . Codeine Nausea And Vomiting    Patient Measurements: Height: 6' 4.5" (194.3 cm) Weight: (!) 458 lb (207.747 kg) IBW/kg (Calculated) : 87.95   Vital Signs: Temp: 98.1 F (36.7 C) (01/20 0800) Temp Source: Oral (01/20 0800) BP: 113/64 mmHg (01/20 1200) Pulse Rate: 73 (01/20 1200)  Labs:  Recent Labs  04/24/14 1540 04/24/14 2357 04/25/14 0555  HGB 13.0 12.3* 12.5*  HCT 39.5 37.3* 38.2*  PLT 181 195 206  APTT 37  --   --   LABPROT 14.0  --  16.4*  INR 1.07  --  1.31  CREATININE 2.56*  --  2.24*  CKTOTAL 68  --   --   CKMB 3.8  --   --   TROPONINI 0.32* 43.94* 52.16*    Estimated Creatinine Clearance: 78.4 mL/min (by C-G formula based on Cr of 2.24).   Medical History: Past Medical History  Diagnosis Date  . Atrial fibrillation   . Stroke     a. 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  Marland Kitchen. HTN (hypertension)   . Hyperlipidemia   . CKD (chronic kidney disease)   . Diabetes mellitus   . OSA (obstructive sleep apnea)   . Morbid obesity     Medications:  Prescriptions prior to admission  Medication Sig Dispense Refill Last Dose  . allopurinol (ZYLOPRIM) 300 MG tablet Take 300 mg by mouth daily.   04/24/2014 at 1030  . atorvastatin (LIPITOR) 80 MG tablet Take 80 mg by mouth daily.   04/24/2014 at 1030  . Chromium-Cinnamon (CINNAMON PLUS CHROMIUM PO) Take 500 mg by mouth 2 (two) times daily. Contains Chromium 100 mcg Two tablets twice a day   04/24/2014 at 1030  . CINNAMON PO Take 1,000 mg by mouth.     . citalopram (CELEXA) 40 MG tablet Take 40 mg by mouth daily.   04/24/2014 at 1030  . Cranberry-Vitamin C-Vitamin E 4200-20-3 MG-MG-UNIT CAPS Take 2 capsules by mouth 2 (two) times daily.    04/24/2014 at 1030  . ezetimibe (ZETIA) 10 MG tablet Take 10 mg by mouth daily.   04/24/2014 at 1030  . furosemide  (LASIX) 80 MG tablet Take 80 mg by mouth daily.   04/24/2014 at 1030  . glimepiride (AMARYL) 4 MG tablet Take 4 mg by mouth 2 (two) times daily with a meal.   04/24/2014 at unk  . hydrALAZINE (APRESOLINE) 100 MG tablet Take 100 mg by mouth 2 (two) times daily.   04/24/2014 at 1030  . insulin glargine (LANTUS) 100 UNIT/ML injection Inject 26 Units into the skin at bedtime.    Past Month at Unknown time  . labetalol (NORMODYNE) 200 MG tablet Take 200 mg by mouth 2 (two) times daily.    04/24/2014 at 1030  . Liraglutide 18 MG/3ML SOPN Inject 1.8 mg into the skin daily.   Past Month at Unknown time  . spironolactone (ALDACTONE) 25 MG tablet Take 25 mg by mouth daily.   04/24/2014 at 1030  . warfarin (COUMADIN) 10 MG tablet Take 10 mg by mouth daily at 6 PM.    04/24/2014 at 1030    Assessment: 49 yo male with history of afib (CHADSVASC=5) and with history of coumadin therapy but he has not been taking he coumadin on a regular basis.  Pharmacy has been consulted to dose coumadin. INR today is  1.31 (up from 1.07 on 1/19).   Patient reported his home dose of coumadin  to be   po daily.  Goal of Therapy:  INR 2-3 Monitor platelets by anticoagulation protocol: Yes   Plan:  -Coumadin  po today -Daily PT/INR  Harland German, Pharm D 04/25/2014 1:43 PM

## 2014-04-25 NOTE — Consult Note (Signed)
[facility] TEAM 1 - Stepdown/ICU TEAM CONSULT F/U Note  Chad Baker ZOX:[id number] DOB: 07-17-1965 DOA: 04/24/2014 PCP: No primary care provider on file.  Brief Narrative: 49 y.o. male referred for elevated blood sugars. Patient presented to the hospital with chest pressure pain and pain in the jaw and was diagnosed with an acute STEMI. Patient stated he is a diabetic and has been poorly controlled. Patient also stated he has a history of stroke in the past and is on chronic anticoagulation. Patient was taken to the cath lab and underwent stent placement times two. Patient has been having issues with his blood sugar being out of control, w/ CBGs > 500. He stated he has been on lantus in the past and he has also been taking victoza.   HPI/Subjective: No new complaints today.  States he did not have any difficulty affording lantus prior to admit, though has not taken it in 2+ months.    Assessment/Plan:  Uncontrolled DM2 CBG improved but not yet at goal - A1c 14.6 - adjust tx and follow - will clearly need to be on insulin at time of d/c - is on ASA already    Hyponatremia  Pseudo due to marked hyperglycemia - resolved   STEMI Per Cardiology (the primary team)   Chronic Afib As per Cardiology   HTN BP currently reasonably controlled for the short course - follow w/o change in tx today  HLD As per Cardiology   CKD  Avoid ACE/ARB at this time - follow crt trend as baseline is unclear   Morbid obesity - Body mass index is 55.03 kg/(m^2).   MRSA screen +  Code Status: FULL Family Communication: signif other present at time of exam   Antibiotics: none  DVT prophylaxis: SQ heparin > warfarin  Objective: Blood pressure 137/73, pulse 60, temperature 98.1 F (36.7 C), temperature source Oral, resp. rate 21, height 6' 4.5" (1.943 m), weight 207.747 kg (458 lb), SpO2 98 %.  Intake/Output Summary (Last 24 hours) at 04/25/14 1132 Last data filed at 04/25/14 0800  Gross per 24 hour    Intake    940 ml  Output   2440 ml  Net  -1500 ml   Exam: General: No acute respiratory distress Lungs: Clear to auscultation bilaterally without wheezes or crackles Cardiovascular: Regular rate and rhythm without murmur gallop or rub normal S1 and S2 Abdomen: Nontender, morbidly obese, soft, bowel sounds positive, no rebound, no ascites, no appreciable mass Extremities: No significant cyanosis, or clubbing, 3+ chronic appearing edema bilateral lower extremities  Data Reviewed: Basic Metabolic Panel:  Recent Labs Lab 04/24/14 1540 04/25/14 0555  NA 127* 136  K 4.2 3.7  CL 94* 101  CO2 25 25  GLUCOSE 710* 194*  BUN 42* 37*  CREATININE 2.56* 2.24*  CALCIUM 8.4 8.6    Liver Function Tests:  Recent Labs Lab 04/24/14 1540  AST 13  ALT 14  ALKPHOS 205*  BILITOT 0.7  PROT 6.6  ALBUMIN 2.9*   Coags:  Recent Labs Lab 04/24/14 1540 04/25/14 0555  INR 1.07 1.31    Recent Labs Lab 04/24/14 1540  APTT 37    CBC:  Recent Labs Lab 04/24/14 1540 04/24/14 2357 04/25/14 0555  WBC 11.6* 10.0 10.9*  HGB 13.0 12.3* 12.5*  HCT 39.5 37.3* 38.2*  MCV 85.3 85.0 84.9  PLT 181 195 206    Cardiac Enzymes:  Recent Labs Lab 04/24/14 1540 04/24/14 2357 04/25/14 0555  CKTOTAL 68  --   --  CKMB 3.8  --   --   TROPONINI 0.32* 43.94* 52.16*   CBG:  Recent Labs Lab 04/24/14 1852 04/24/14 2011 04/24/14 2354 04/25/14 0357 04/25/14 0806  GLUCAP 493* 497* 379* 234* 205*    Recent Results (from the past 240 hour(s))  MRSA PCR Screening     Status: Abnormal   Collection Time: 04/24/14  6:17 PM  Result Value Ref Range Status   MRSA by PCR POSITIVE (A) NEGATIVE Final    Comment:        The GeneXpert MRSA Assay (FDA approved for NASAL specimens only), is one component of a comprehensive MRSA colonization surveillance program. It is not intended to diagnose MRSA infection nor to guide or monitor treatment for MRSA infections. RESULT CALLED TO, READ BACK  BY AND VERIFIED WITH: Stevan BornM KUSSOUR RN [id number] 04/24/14 A BROWNING      Studies:  Recent x-ray studies have been reviewed in detail by the Attending Physician  Scheduled Meds:  Scheduled Meds: . allopurinol  300 mg Oral Daily  . atorvastatin  80 mg Oral q1800  . carvedilol  12.5 mg Oral BID WC  . Chlorhexidine Gluconate Cloth  6 each Topical Q0600  . citalopram  40 mg Oral Daily  . clopidogrel  75 mg Oral Q breakfast  . ezetimibe  10 mg Oral Daily  . furosemide  80 mg Oral Daily  . glimepiride  4 mg Oral BID WC  . heparin  5,000 Units Subcutaneous 3 times per day  . hydrALAZINE  100 mg Oral BID  . insulin aspart  0-20 Units Subcutaneous 6 times per day  . insulin glargine  25 Units Subcutaneous QHS  . mupirocin ointment  1 application Nasal BID  . sodium chloride  3 mL Intravenous Q12H  . spironolactone  25 mg Oral Daily  . warfarin  10 mg Oral q1800  . Warfarin - Physician Dosing Inpatient   Does not apply q1800    Time spent on care of this patient: 35 mins   [facility] GreenvilleMCCLUNG,Shawnika Pepin T , MD   [facility] Office  [phone] Pager - Text Page per Amion as per below:  On-Call/Text Page:      [email or web address]      password [email or web address]  If 7PM-7AM, please contact night-coverage [email or web address] Password [email or web address] 04/25/2014, 11:32 AM   LOS: 1 day

## 2014-04-25 NOTE — Progress Notes (Signed)
Nutrition Brief Note  Patient identified on the Malnutrition Screening Tool (MST) Report.  Wt Readings from Last 15 Encounters:  04/25/14 458 lb (207.747 kg)    Body mass index is 55.03 kg/(m^2). Patient meets criteria for class 3, extreme/morbid obesity based on current BMI.   Current diet order is heart healthy CHO modified, patient is consuming approximately 100% of meals at this time. Labs and medications reviewed.   No nutrition interventions warranted at this time. If nutrition issues arise, please consult RD.    Joaquin CourtsKimberly Harris, RD, LDN, CNSC Pager [phone] After Hours Pager [phone]

## 2014-04-25 NOTE — Care Management Note (Addendum)
    Page 1 of 1   04/25/2014     3:14:12 PM CARE MANAGEMENT NOTE 04/25/2014  Patient:  Chad Baker,Chad Baker   Account Number:  [id number]  Date Initiated:  04/25/2014  Documentation initiated by:  Junius CreamerWELL,DEBBIE  Subjective/Objective Assessment:   adm w stemi     Action/Plan:   lives w fam   Anticipated DC Date:     Anticipated DC Plan:  HOME/SELF CARE      DC Planning Services  CM consult  Follow-up appt scheduled      Choice offered to / List presented to:             Status of service:   Medicare Important Message given?   (If response is "NO", the following Medicare IM given date fields will be blank) Date Medicare IM given:   Medicare IM given by:   Date Additional Medicare IM given:   Additional Medicare IM given by:    Discharge Disposition:    Per UR Regulation:  Reviewed for med. necessity/level of care/duration of stay  If discussed at Long Length of Stay Meetings, dates discussed:    Comments:  1/20  1512 debbie Matis Monnier rn.bsn md wanted us to arrange appt w dr Darrick Huntsmantullo in [place]. called her office and no taking new patients. was able to get pt appt w nse practictioner carrie doss for 05-03-14 at 1pm, needs to be there by 12:30pm  1/20 0949 debbie Eashan Schipani rn,bsn spoke w pt. gave him inform on [facility]. gave him health connect phone number and explained most practices take medicare he just needs to find one in area he wants to go to.

## 2014-04-25 NOTE — Progress Notes (Addendum)
Subjective:   49 y.o. male with severe morbid obesity, prior CVA (with hemorrhagic component) in 2000, hypertension, hyperlipidemia, chronic renal insufficiency (? Baseline ~ 2.5) , type 2 diabetes mellitus poorly controlled, OSA, chronic atrial fibrillation supposedly on warfarin (INR on admit 1.0). He was transferred from [facility] or inferior STEMI on 1/19 after presenting to the emergency room there and found to have inferior ST elevations in leads II, III, aVF with appropriate reciprocal changes. He was also noted to be hyperglycemic with a blood sugar of greater than 600.   Cath 1/19  LAD 20%, LCx 20% RCA totally occluded  -> treated with BMS. Peak troponin 52  Denies CP. Says he wasn't taking any meds PTA. Just moved from [place].     Intake/Output Summary (Last 24 hours) at 04/25/14 1149 Last data filed at 04/25/14 0800  Gross per 24 hour  Intake    940 ml  Output   2440 ml  Net  -1500 ml    Current meds: . allopurinol  300 mg Oral Daily  . atorvastatin  80 mg Oral q1800  . carvedilol  12.5 mg Oral BID WC  . Chlorhexidine Gluconate Cloth  6 each Topical Q0600  . citalopram  40 mg Oral Daily  . clopidogrel  75 mg Oral Q breakfast  . ezetimibe  10 mg Oral Daily  . furosemide  80 mg Oral Daily  . heparin  5,000 Units Subcutaneous 3 times per day  . hydrALAZINE  100 mg Oral BID  . insulin aspart  0-20 Units Subcutaneous TID WC  . insulin aspart  3 Units Subcutaneous TID WC  . insulin glargine  32 Units Subcutaneous QHS  . mupirocin ointment  1 application Nasal BID  . sodium chloride  3 mL Intravenous Q12H  . spironolactone  25 mg Oral Daily  . warfarin  10 mg Oral q1800  . Warfarin - Physician Dosing Inpatient   Does not apply q1800   Infusions:     Objective:  Blood pressure 137/73, pulse 60, temperature 98.1 F (36.7 C), temperature source Oral, resp. rate 21, height 6' 4.5" (1.943 m), weight 207.747 kg (458 lb), SpO2 98 %. Weight change:    Physical Exam: General:  Morbidly obese male lying in bed. No resp difficulty HEENT: normal Neck: supple. Unable to see JVP. Carotids 2+ bilat; no bruits. No lymphadenopathy or thryomegaly appreciated. Cor: PMI nondisplaced. Regular rate & rhythm. No rubs, gallops or murmurs. Lungs: clear Abdomen: markedly obese soft, nontender, nondistended.Good bowel sounds. Extremities: no cyanosis, clubbing, rash, edema + venous stasis changes Neuro: alert & orientedx3, cranial nerves grossly intact. moves all 4 extremities w/o difficulty. Affect pleasant  Telemetry: AF   Lab Results: Basic Metabolic Panel:  Recent Labs Lab 04/24/14 1540 04/25/14 0555  NA 127* 136  K 4.2 3.7  CL 94* 101  CO2 25 25  GLUCOSE 710* 194*  BUN 42* 37*  CREATININE 2.56* 2.24*  CALCIUM 8.4 8.6   Liver Function Tests:  Recent Labs Lab 04/24/14 1540  AST 13  ALT 14  ALKPHOS 205*  BILITOT 0.7  PROT 6.6  ALBUMIN 2.9*   No results for input(s): LIPASE, AMYLASE in the last 168 hours. No results for input(s): AMMONIA in the last 168 hours. CBC:  Recent Labs Lab 04/24/14 1540 04/24/14 2357 04/25/14 0555  WBC 11.6* 10.0 10.9*  HGB 13.0 12.3* 12.5*  HCT 39.5 37.3* 38.2*  MCV 85.3 85.0 84.9  PLT 181 195 206   Cardiac Enzymes:  Recent Labs Lab 04/24/14 1540 04/24/14 2357 04/25/14 0555  CKTOTAL 68  --   --   CKMB 3.8  --   --   TROPONINI 0.32* 43.94* 52.16*   BNP: Invalid input(s): POCBNP CBG:  Recent Labs Lab 04/24/14 2011 04/24/14 2354 04/25/14 0357 04/25/14 0806 04/25/14 1131  GLUCAP 497* 379* 234* 205* 158*   Microbiology: No results found for: CULT No results for input(s): CULT, SDES in the last 168 hours.  Imaging: No results found.   ASSESSMENT:  1. Inferior STEMI    --s/p BMS to RCA on 1/19 2. CAD    --residual mild non-obs CAD in LAD and LCX  3. DM, poorly controlled    --HgbA1c 14.6 4. CKD, stage II     --baseline seems to be ~ 2.5 5. Super morbid  obesity 6. OSA 7. Chronic AF     --chadsvasc = 5 (DM, HTN, CAD, CVA) 9. OSA on CPAP   PLAN/DISCUSSION:  Stable after PCI. Treat with ASA/Plavix. He is willing to restart coumadin. This has been restarted. Will check P2Y12 level. If therapeutic will d/c ASA on d/c and continue Plavix/coumadin. If P2Y12 high would continue ASA 81 in addition to Plavix and coumadin for at least 1 month.  Treat with statin/b-blocker. Await echo. Can go to tele. Consult CR.   Needs PCP in [place]. Will ask SW to help arrange f/u visit with Dr. Darrick Huntsman.    LOS: 1 day    Arvilla Meres, MD 04/25/2014, 11:49 AM

## 2014-04-25 NOTE — Progress Notes (Signed)
New CSW order to assist patient with follow appointment with Dr. Darrick Huntsmanullo.  This is not a function of CSW services. Discussed with Junius Creamerebbie Dowell, RNCM. She will check on this.  CSW signing off. Lorri Frederickonna T. Jaci LazierCrowder, Kentucky[place]CSW [phone]

## 2014-04-25 NOTE — Progress Notes (Signed)
CSW order in place that [facility] [place] and needs PCP. This order is more appropriate for RNCM and will notify Junius Creamerebbie Dowell, RN of above.  CSW will sign off but available if future needs are indicated.  Chad Baker, Kentucky[place]CS[place] [phone]

## 2014-04-25 NOTE — Progress Notes (Signed)
Echocardiogram 2D Echocardiogram has been performed.  Dorothey BasemanReel, Rayanne Padmanabhan M 04/25/2014, 10:39 AM

## 2014-04-26 ENCOUNTER — Other Ambulatory Visit: Payer: Self-pay | Admitting: Cardiology

## 2014-04-26 ENCOUNTER — Encounter (HOSPITAL_COMMUNITY): Payer: Self-pay | Admitting: Nurse Practitioner

## 2014-04-26 DIAGNOSIS — E1100 Type 2 diabetes mellitus with hyperosmolarity without nonketotic hyperglycemic-hyperosmolar coma (NKHHC): Secondary | ICD-10-CM

## 2014-04-26 DIAGNOSIS — N289 Disorder of kidney and ureter, unspecified: Secondary | ICD-10-CM

## 2014-04-26 LAB — BASIC METABOLIC PANEL
Anion gap: 9 (ref 5–15)
BUN: 37 mg/dL — ABNORMAL HIGH (ref 6–23)
CO2: 26 mmol/L (ref 19–32)
Calcium: 8.6 mg/dL (ref 8.4–10.5)
Chloride: 100 mEq/L (ref 96–112)
Creatinine, Ser: 2.6 mg/dL — ABNORMAL HIGH (ref 0.50–1.35)
GFR calc Af Amer: 32 mL/min — ABNORMAL LOW (ref >90)
GFR calc non Af Amer: 28 mL/min — ABNORMAL LOW (ref >90)
Glucose, Bld: 218 mg/dL — ABNORMAL HIGH (ref 70–99)
Potassium: 4.3 mmol/L (ref 3.5–5.1)
Sodium: 135 mmol/L (ref 135–145)

## 2014-04-26 LAB — PLATELET INHIBITION P2Y12: Platelet Function  P2Y12: 124 [PRU] — ABNORMAL LOW (ref 194–418)

## 2014-04-26 LAB — PROTIME-INR
INR: 1.24 (ref 0.00–1.49)
Prothrombin Time: 15.7 seconds — ABNORMAL HIGH (ref 11.6–15.2)

## 2014-04-26 LAB — GLUCOSE, CAPILLARY
Glucose-Capillary: 235 mg/dL — ABNORMAL HIGH (ref 70–99)
Glucose-Capillary: 235 mg/dL — ABNORMAL HIGH (ref 70–99)
Glucose-Capillary: 238 mg/dL — ABNORMAL HIGH (ref 70–99)
Glucose-Capillary: 266 mg/dL — ABNORMAL HIGH (ref 70–99)

## 2014-04-26 MED ORDER — FREESTYLE SYSTEM KIT: 1.0000 | PACK | Status: AC | PRN

## 2014-04-26 MED ORDER — WARFARIN - PHARMACIST DOSING INPATIENT: Freq: Every day | Status: DC

## 2014-04-26 MED ORDER — INSULIN GLARGINE 100 UNIT/ML ~~LOC~~ SOLN
35.0000 [IU] | Freq: Every day | SUBCUTANEOUS | Status: DC
  Filled 2014-04-26: qty 0.35

## 2014-04-26 MED ORDER — WARFARIN SODIUM 10 MG PO TABS: 10.0000 mg | ORAL_TABLET | Freq: Once | ORAL | Status: DC

## 2014-04-26 MED ORDER — INSULIN ASPART 100 UNIT/ML ~~LOC~~ SOLN: 10.0000 [IU] | Freq: Three times a day (TID) | SUBCUTANEOUS | Status: DC

## 2014-04-26 MED ORDER — INSULIN GLARGINE 100 UNIT/ML ~~LOC~~ SOLN: 35.0000 [IU] | Freq: Every day | SUBCUTANEOUS | Status: DC

## 2014-04-26 MED ORDER — CLOPIDOGREL BISULFATE 75 MG PO TABS: 75.0000 mg | ORAL_TABLET | Freq: Every day | ORAL | Status: DC

## 2014-04-26 MED ORDER — INSULIN ASPART 100 UNIT/ML ~~LOC~~ SOLN
10.0000 [IU] | Freq: Three times a day (TID) | SUBCUTANEOUS | Status: DC
Start: 2014-04-26 — End: 2014-04-26
  Administered 2014-04-26: 10 [IU] via SUBCUTANEOUS

## 2014-04-26 MED ORDER — INSULIN ASPART 100 UNIT/ML ~~LOC~~ SOLN
7.0000 [IU] | Freq: Three times a day (TID) | SUBCUTANEOUS | Status: DC
Start: 2014-04-26 — End: 2014-04-26

## 2014-04-26 MED ORDER — LIRAGLUTIDE 18 MG/3ML ~~LOC~~ SOPN: 1.8000 mg | PEN_INJECTOR | Freq: Every day | SUBCUTANEOUS | Status: DC

## 2014-04-26 MED ORDER — CARVEDILOL 12.5 MG PO TABS: 12.5000 mg | ORAL_TABLET | Freq: Two times a day (BID) | ORAL | Status: DC

## 2014-04-26 MED ORDER — ASPIRIN 81 MG PO TBEC: 81.0000 mg | DELAYED_RELEASE_TABLET | Freq: Every day | ORAL | Status: DC

## 2014-04-26 NOTE — Progress Notes (Addendum)
ANTICOAGULATION CONSULT NOTE - Follow Up Consult  Pharmacy Consult for warfarin Indication: atrial fibrillation  Allergies  Allergen Reactions  . Codeine Nausea And Vomiting    Patient Measurements: Height: 6' 4.5" (194.3 cm) Weight: (!) 470 lb 6.4 oz (213.372 kg) IBW/kg (Calculated) : 87.95  Vital Signs: Temp: 98.2 F (36.8 C) (01/21 0400) BP: 106/65 mmHg (01/21 1008) Pulse Rate: 96 (01/21 0820)  Labs:  Recent Labs  04/24/14 1540 04/24/14 2357 04/25/14 0555 04/26/14 0400 04/26/14 1104  HGB 13.0 12.3* 12.5*  --   --   HCT 39.5 37.3* 38.2*  --   --   PLT 181 195 206  --   --   APTT 37  --   --   --   --   LABPROT 14.0  --  16.4* 15.7*  --   INR 1.07  --  1.31 1.24  --   CREATININE 2.56*  --  2.24*  --  2.60*  CKTOTAL 68  --   --   --   --   CKMB 3.8  --   --   --   --   TROPONINI 0.32* 43.94* 52.16*  --   --     Estimated Creatinine Clearance: 68.7 mL/min (by C-G formula based on Cr of 2.6).  Assessment: 49 y/o male with history of Afib (CHADSVASC is 5) and with history of warfarin therapy but he has not been taking it on a regular basis. INR is SUBtherapeutic at 1.24 this morning. No bleeding noted, CBC is stable.  PTA: 10 mg/daily  Goal of Therapy:  INR 2-3 Monitor platelets by anticoagulation protocol: Yes   Plan:  - Repeat warfarin 10 mg PO tonight - INR daily - D/C SQ heparin when INR >2 - Monitor for s/sx of bleeding  Martha Jefferson HospitalJennifer Luling, [address] BoulevardPharm.D., BCPS Clinical Pharmacist Pager: [phone] 04/26/2014 1:16 PM

## 2014-04-26 NOTE — Discharge Instructions (Signed)
Cardiac Diet  A cardiac diet can help stop heart disease or a stroke from happening. It involves eating less unhealthy fats and eating more healthy fats.   FOODS TO AVOID OR LIMIT  · Limit saturated fats. This type of fat is found in oils and dairy products, such as:  ¨ Coconut oil.  ¨ Palm oil.  ¨ Cocoa butter.  ¨ Butter.  · Avoid trans-fat or hydrogenated oils. These are found in fried or pre-made baked goods, such as:  ¨ Margarine.  ¨ Pre-made cookies, cakes, and crackers.  · Limit processed meats (hot dogs, deli meats, sausage) to 3 ounces a week.  · Limit high-fat meats (marbled meats, fried chicken, or chicken with skin) to 3 ounces a week.  · Limit salt (sodium) to 1500 milligrams a day.  ·  Limit sweets and drinks with added sugar to no more than 5 servings a week. One serving is:  ¨ 1 tablespoon of sugar.  ¨ 1 tablespoon of jelly or jam.  ¨ ½ cup sorbet.  ¨ 1 cup lemonade.  ¨ ½ cup regular soda.  EAT MORE OF THE FOLLOWING FOODS  Fruit  · Eat 4 to 5 servings a day. One serving of fruit is:  ¨ 1 medium whole fruit.  ¨ ¼ cup dried fruit.  ¨ ½ cup of fresh, frozen, or canned fruit.  ¨ ½ cup 100% fruit juice.  Vegetables  · Eat 4 to 5 servings a day. One serving is:  ¨ 1 cup raw leafy vegetables.  ¨ ½ cup raw or cooked, cut-up vegetables.  ¨ ½ cup vegetable juice.  Whole Grains  · Eat 3 servings a day (1 ounce equals 1 serving).  Legumes (such as beans, peas, and lentils)   · Eat at least 4 servings a week (½ cup equals 1 serving).  Nuts and Seeds   · Eat at least 4 servings a week (¼ cup equals 1 serving).  Dietary Fiber  · Eat 20 to 30 grams a day. Some foods high in dietary fiber include:  ¨ Dried beans.  ¨ Citrus fruits.  ¨ Apples, bananas.  ¨ Broccoli, Brussels sprouts, and eggplant.  ¨ Oats.  Omega-3 Fats  · Eat food with omega-3 fats. You can also take a dietary pill (supplement) that has 1 gram of DHA and EPA. Have 3.5 ounces of fatty fish a week, such as:  ¨ Salmon.  ¨ Mackerel.  ¨ Albacore  tuna.  ¨ Sardines.  ¨ Lake trout.  ¨ Herring.  PREPARING YOUR FOOD  · Broil, bake, steam, or roast foods. Do not fry food. Do not cook food in butter (fat).  · Use non-stick cooking sprays.  · Remove skin from poultry, such as chicken and turkey.  · Remove fat from meat.  · Take the fat off the top of stews, soups, and gravy.  · Use lemon or herbs to flavor food instead of using butter or margarine.  · Use nonfat yogurt, salsa, or low-fat dressings for salads.  Document Released: 09/22/2011 Document Reviewed: 09/22/2011  ExitCare® Patient Information ©2015 [employer]. This information is not intended to replace advice given to you by your health care provider. Make sure you discuss any questions you have with your health care provider.

## 2014-04-26 NOTE — Progress Notes (Signed)
SUBJECTIVE:  Denies any chest pain.  No SOB   PHYSICAL EXAM Filed Vitals:   04/26/14 0009 04/26/14 0400 04/26/14 0820 04/26/14 1008  BP: 115/70 120/70 108/62 106/65  Pulse:  81 96   Temp:  98.2 F (36.8 C)    TempSrc:      Resp:  18    Height:      Weight:  470 lb 6.4 oz (213.372 kg)    SpO2:  96%     General:  No distress Lungs:  Clear Heart:  RRR Abdomen:  Positive bowel sounds, no rebound no guarding Extremities:  Mild edema   LABS: Lab Results  Component Value Date   TROPONINI 52.16* 04/25/2014   Results for orders placed or performed during the hospital encounter of 04/24/14 (from the past 24 hour(s))  Glucose, capillary     Status: Abnormal   Collection Time: 04/25/14 11:31 AM  Result Value Ref Range   Glucose-Capillary 158 (H) 70 - 99 mg/dL   Comment 1 Capillary Sample   Glucose, capillary     Status: Abnormal   Collection Time: 04/25/14  4:58 PM  Result Value Ref Range   Glucose-Capillary 213 (H) 70 - 99 mg/dL  Glucose, capillary     Status: Abnormal   Collection Time: 04/25/14  9:42 PM  Result Value Ref Range   Glucose-Capillary 218 (H) 70 - 99 mg/dL  Glucose, capillary     Status: Abnormal   Collection Time: 04/26/14 12:07 AM  Result Value Ref Range   Glucose-Capillary 235 (H) 70 - 99 mg/dL  Protime-INR     Status: Abnormal   Collection Time: 04/26/14  4:00 AM  Result Value Ref Range   Prothrombin Time 15.7 (H) 11.6 - 15.2 seconds   INR 1.24 0.00 - 1.49  Platelet inhibition p2y12     Status: Abnormal   Collection Time: 04/26/14  4:00 AM  Result Value Ref Range   Platelet Function  P2Y12 124 (L) 194 - 418 PRU  Glucose, capillary     Status: Abnormal   Collection Time: 04/26/14  5:01 AM  Result Value Ref Range   Glucose-Capillary 238 (H) 70 - 99 mg/dL  Glucose, capillary     Status: Abnormal   Collection Time: 04/26/14  7:37 AM  Result Value Ref Range   Glucose-Capillary 266 (H) 70 - 99 mg/dL    Intake/Output Summary (Last 24 hours) at  04/26/14 1035 Last data filed at 04/26/14 1610  Gross per 24 hour  Intake    480 ml  Output    800 ml  Net   -320 ml    ECHO:    - Left ventricle: The cavity size was normal. Wall thickness was increased in a pattern of mild LVH. Indeterminant diastolic function (atrial fibrillation). Systolic function was mildly reduced. The estimated ejection fraction was in the range of 45% to 50%. Basal to mid inferior akinesis, basal inferoseptal akinesis. - Aortic valve: There was no stenosis. - Mitral valve: There was trivial regurgitation. - Left atrium: The atrium was moderately dilated. - Right ventricle: The cavity size was mildly dilated. Systolic function was moderately reduced. RV free wall hypokinetic, apex contracts normally. - Right atrium: The atrium was mildly dilated. - Pulmonary arteries: No complete TR doppler jet so unable to estimate PA systolic pressure. - Systemic veins: IVC measured 2.3 cm with normal respirophasic variation, suggesting RA pressure 8 mmHg. - Pericardium, extracardiac: A trivial pericardial effusion was identified posterior to the heart.  ASSESSMENT  AND PLAN:  INFERIOR MI:  BMS to RCA.  Mild LV dysfunction.    P2Y12 OK.  Stop ASA when INR greater than 2.0.  CHRONIC ATRIAL FIB:  Warfarin restarted.    Need to arrange follow up of warfarin.   CKD:  Check creat today.    Need to check a BMET today.    DM:  Follow up with PCP arranged.  We appreciate primary care input.    HTN:  Controlled on current meds.     HYPERLIPIDEMIA:   Continue high dose statin.    Home today if creat is stable.    Fayrene FearingJames Meeker General Hospitalochrein 04/26/2014 10:35 AM

## 2014-04-26 NOTE — Consult Note (Signed)
CONSULT F/U Note  Chad Baker ZOX:[id number] DOB: [birth year] DOA: 04/24/2014 PCP: No primary care provider on file.  Brief Narrative: 49 y.o. male referred for elevated blood sugars. Patient presented to the hospital with chest pressure pain and pain in the jaw and was diagnosed with an acute STEMI. Patient stated he is a diabetic and has been poorly controlled. Patient also stated he has a history of stroke in the past and is on chronic anticoagulation. Patient was taken to the cath lab and underwent stent placement times two. Patient has been having issues with his blood sugar being out of control, w/ CBGs > 500. He stated he has been on lantus in the past and he has also been taking victoza.   HPI/Subjective: Says he is going home today  Assessment/Plan: Uncontrolled DM2 CBG improved- will need further titration outpatient -patient has given self shots in past and has no issues with affording  - A1c 14.6 -can be d/c'd on current regimin with further titration as outpatient   Hyponatremia  resolved   STEMI Per Cardiology (the primary team)   Chronic Afib As per Cardiology   HTN BP currently reasonably controlled for the short course - follow w/o change in tx today  HLD As per Cardiology   CKD  Avoid ACE/ARB at this time - follow crt trend as baseline is unclear   Morbid obesity - Body mass index is 56.52 kg/(m^2).   MRSA screen +  Code Status: FULL Family Communication: no family available  Antibiotics: none  DVT prophylaxis: SQ heparin > warfarin  Objective: Blood pressure 106/65, pulse 96, temperature 98.2 F (36.8 C), temperature source Oral, resp. rate 18, height 6' 4.5" (1.943 m), weight 213.372 kg (470 lb 6.4 oz), SpO2 96 %.  Intake/Output Summary (Last 24 hours) at 04/26/14 1019 Last data filed at 04/26/14 8119  Gross per 24 hour  Intake    480 ml  Output    800 ml  Net   -320 ml   Exam: General: No acute respiratory distress Lungs: Clear to  auscultation bilaterally without wheezes or crackles Cardiovascular: Regular rate and rhythm without murmur gallop or rub normal S1 and S2 Abdomen: Nontender, morbidly obese, soft, bowel sounds positive, no rebound, no ascites, no appreciable mass Extremities: No significant cyanosis, or clubbing, 3+ chronic appearing edema bilateral lower extremities  Data Reviewed: Basic Metabolic Panel:  Recent Labs Lab 04/24/14 1540 04/25/14 0555  NA 127* 136  K 4.2 3.7  CL 94* 101  CO2 25 25  GLUCOSE 710* 194*  BUN 42* 37*  CREATININE 2.56* 2.24*  CALCIUM 8.4 8.6    Liver Function Tests:  Recent Labs Lab 04/24/14 1540  AST 13  ALT 14  ALKPHOS 205*  BILITOT 0.7  PROT 6.6  ALBUMIN 2.9*   Coags:  Recent Labs Lab 04/24/14 1540 04/25/14 0555 04/26/14 0400  INR 1.07 1.31 1.24    Recent Labs Lab 04/24/14 1540  APTT 37    CBC:  Recent Labs Lab 04/24/14 1540 04/24/14 2357 04/25/14 0555  WBC 11.6* 10.0 10.9*  HGB 13.0 12.3* 12.5*  HCT 39.5 37.3* 38.2*  MCV 85.3 85.0 84.9  PLT 181 195 206    Cardiac Enzymes:  Recent Labs Lab 04/24/14 1540 04/24/14 2357 04/25/14 0555  CKTOTAL 68  --   --   CKMB 3.8  --   --   TROPONINI 0.32* 43.94* 52.16*   CBG:  Recent Labs Lab 04/25/14 1658 04/25/14 2142 04/26/14 0007 04/26/14 0501 04/26/14  0737  GLUCAP 213* 218* 235* 238* 266*    Recent Results (from the past 240 hour(s))  MRSA PCR Screening     Status: Abnormal   Collection Time: 04/24/14  6:17 PM  Result Value Ref Range Status   MRSA by PCR POSITIVE (A) NEGATIVE Final    Comment:        The GeneXpert MRSA Assay (FDA approved for NASAL specimens only), is one component of a comprehensive MRSA colonization surveillance program. It is not intended to diagnose MRSA infection nor to guide or monitor treatment for MRSA infections. RESULT CALLED TO, READ BACK BY AND VERIFIED WITH: Stevan BornM KUSSOUR RN [phone] 04/24/14 A BROWNING        Scheduled  Meds:  Scheduled Meds: . allopurinol  300 mg Oral Daily  . aspirin EC  81 mg Oral Daily  . atorvastatin  80 mg Oral q1800  . carvedilol  12.5 mg Oral BID WC  . chlorhexidine  15 mL Mouth Rinse BID  . Chlorhexidine Gluconate Cloth  6 each Topical Q0600  . citalopram  40 mg Oral Daily  . clopidogrel  75 mg Oral Q breakfast  . ezetimibe  10 mg Oral Daily  . furosemide  80 mg Oral Daily  . heparin  5,000 Units Subcutaneous 3 times per day  . hydrALAZINE  100 mg Oral BID  . insulin aspart  0-20 Units Subcutaneous TID WC  . insulin aspart  7 Units Subcutaneous TID WC  . insulin glargine  32 Units Subcutaneous QHS  . mupirocin ointment  1 application Nasal BID  . sodium chloride  3 mL Intravenous Q12H  . spironolactone  25 mg Oral Daily  . Warfarin - Physician Dosing Inpatient   Does not apply q1800    Time spent on care of this patient: 15 mins   Alvah Gilder , DO   [facility] [phone]  If 7PM-7AM, please contact night-coverage [email or web address] Password [email or web address] 04/26/2014, 10:19 AM   LOS: 2 days

## 2014-04-26 NOTE — Progress Notes (Signed)
Inpatient Diabetes Program Recommendations  AACE/ADA: New Consensus Statement on Inpatient Glycemic Control (2013)  Target Ranges:  Prepandial:   less than 140 mg/dL      Peak postprandial:   less than 180 mg/dL (1-2 hours)      Critically ill patients:  140 - 180 mg/dL   Reason for assessment: discharging home  Diabetes history: Type 2 Outpatient Diabetes medications: Victoza 1.8mg , Lantus 26 units, amaryl 2mg  bid,  Current orders for Inpatient glycemic control: Lantus 35 units qhs, Novolog 10 units tid with meals, novolog sliding scale 0-20 units tid with meals  Spoke with patient by phone.  Admits he stopped taking Victoza and Lantus when he moved here from [place] in November 2015- it had nothing to do with coverage or cost- he simply stopped taking it.  States his A1C has been around 6.8% when he was in [place].  Now willing to get back on his medications.  Has not experienced nausea with Victoza in the past. Patient was oriented to the location of the [facility] where his appointment with the PA has been scheduled.  I explained the services of the [facility] in [place] for diabetes education and nutrition education.  I have sent him the name of the [facility] and their phone number to his e-mail address as requested at [email or web address].  Patient did not bring meter or supplies with him when he came from [place] therefore he will need a prescription for;  Meter, lancets and testing strips Medications-  including insulin, insulin needles and Victoza   Chad RacerJulie Royanne Warshaw, RN, [place]BA, [place]HA, CDE Diabetes Coordinator Inpatient Diabetes Program  [phone] (Team Pager) [phone] Patrcia Dolly([place] Office) 04/26/2014 11:57 AM

## 2014-04-26 NOTE — Progress Notes (Signed)
CARDIAC REHAB PHASE I   PRE:  Rate/Rhythm: 101 afib    BP: sitting 131/83    SaO2: 98 RA  MODE:  Ambulation: 650 ft   POST:  Rate/Rhythm: 140 afib    BP: sitting 106/66     SaO2: 98 RA  Pt eager to get OOB and walk. SOB with walking, HR up to 140 periodically. Pt sts this is his norm and he feels good. To bariatric chair after walk. Pt sts he is eager to walk and be healthier. Gave walking gl and discussed CRPII. Pt interested and will send referral to [facility]. Pt understands carb counting and managing DM. Sts he has just been out of practice of taking meds since moving here 2 months ago.  [phone]   Elissa LovettReeve, Tyianna Menefee PhillipsburgKristan CES, ACSM 04/26/2014 11:36 AM

## 2014-04-26 NOTE — Discharge Summary (Signed)
Physician Discharge Summary  Patient ID: Chad Baker MRN: [id number] DOB/AGE: [birth year] 48 y.o.   Primary Cardiologist: Dr. Ellyn Hack  Admit date: 04/24/2014 Discharge date: 04/26/2014    Admission Diagnoses: Inferior STEMI  Discharge Diagnoses:  Principal Problem:   ST-segment elevation myocardial infarction (STEMI) of inferior wall Active Problems:   CAD S/P 2 SITE BMS PCI TO PROX & DISTAL RCA -    Chronic atrial fibrillation   Type II diabetes mellitus with hyperosmolarity, uncontrolled   Chronic venous stasis dermatitis of both lower extremities   Bilateral lower extremity edema - severe   Essential hypertension   Morbid obesity with body mass index of 50 or higher   Discharged Condition: stable  Hospital Course: Obrien Cleland is a 49 y.o. male with severe morbid obesity, prior CVA (with hemorrhagic component) in 2000, hypertension, hyperlipidemia, chronic renal insufficiency with unknown baseline, type 2 diabetes mellitus poorly controlled, OSA, chronic atrial fibrillation, on warfarin. He recently moved from [place] but did not establish care with a cardiologist on arrival to [facility].     He was transferred to [facility] from [facility] 04/24/14 for inferior STEMI after presenting to the emergency room there and found to have inferior ST elevations in leads II, III, aVF with appropriate reciprocal changes. He was also noted to be hyperglycemic with a blood sugar of greater than 600. Creatinine was 2.72. INR was subtherapeutic at 1.0. Due to his morbid obesity, he was too heavy for the [facility] cardiac catheterization lab, therefore he was transferred to [facility] for urgent cardiac catheterization.  The procedure was performed by Dr. Ellyn Hack. He was found to have severe single-vessel CAD with thrombotic occlusion of the proximal RCA with the distal RCA lesion as well. Both sites were successfully treated with large bare-metal stents. TIMI-3 flow was restored. There was mild  disease in the left coronary system as outlined below. He tolerated the procedure well and left the cath lab in stable condition. A 2D echo was obtained and revealed mild LV dysfunction with EF of 45-50%. He was started on antiplatelet therapy with ASA + Plavix. Recommendations were given to continue triple therapy for one month (ASA + Plavix + warfarin) then discontinue ASA and resume Plavix + warfarin. He was also started on a statin and BB. Decision was made to delay initiation of an ACE/ARB given renal insufficiency. Hydralazine was added for additional afterload reduction.  He was continued on insulin therapy for his diabetes. He had no major complications and no recurrent CP. He did have renal insufficiency with Scr day of discharge at 2.60. He has been ordered to get a repeat BMP on 04/30/14. He has also been scheduled for an INR check on 1/25. Post hospital f/u has been arranged for 05/08/14. He will be followed long term by Dr. Ellyn Hack.   Consults: None  Significant Diagnostic Studies:   [facility] 04/24/2014 Left Ventriculography: Not performed  Coronary Anatomy:  Dominance: Right  Left Main: Very Large-caliber vessel that trifurcates into the LAD, Ramus Intermedius, Circumflex. Angiographic normal. LAD: Normal caliber vessel with diffuse mild tender 20% lesions. Vessel courses down around the apex perfusing the distal one third of the inferoapex.  D1: Moderate large-caliber proximal diagonal branch that bifurcates. It is also relatively free of disease.  Left Circumflex: Very Large-caliber vessel with a very small first marginal branch in the mid AV groove. Just prior to that there is a focal 20% stenosis. The vessel then really terminates as a large lateral OM with  a very small AV groove branch. Tortuous vessel but free of significant disease. Her main is large caliber almost all the way to the distal end.  Ramus intermedius: Normal/large-caliber vessel that bifurcates into 2 branches, one that  courses as a proximal diagonal and the another branch that courses as an obtuse marginal. No obvious lesions noted.   RCA: Extremely large caliber, dominant vessel that is occluded at the proximal segment just after the first bend. There is a bird's beak appearance. There is calcification noted in the mid vessel.  Post wire advancement revealed a large caliber vessel continuing on distally where bifurcates into the Right Posterior AV Groove Branch (RPAV) and the Right Posterior Descending Artery (RPDA). At the crux there is a focal 80% hazy lesion. This was actually identified following balloon angioplasty site where distal thromboembolism occluded the vessel.  RPDA: Moderate large-caliber tortuous vessel with minimal disease.  RPL Sysytem:The RPAV begins as a moderate-caliber vessel that really terminates as a moderate caliber posterolateral branch that appears to be occluded distally in area to far to advance wire.  2D echo 04/25/14 Study Conclusions  - Left ventricle: The cavity size was normal. Wall thickness was increased in a pattern of mild LVH. Indeterminant diastolic function (atrial fibrillation). Systolic function was mildly reduced. The estimated ejection fraction was in the range of 45% to 50%. Basal to mid inferior akinesis, basal inferoseptal akinesis. - Aortic valve: There was no stenosis. - Mitral valve: There was trivial regurgitation. - Left atrium: The atrium was moderately dilated. - Right ventricle: The cavity size was mildly dilated. Systolic function was moderately reduced. RV free wall hypokinetic, apex contracts normally. - Right atrium: The atrium was mildly dilated. - Pulmonary arteries: No complete TR doppler jet so unable to estimate PA systolic pressure. - Systemic veins: IVC measured 2.3 cm with normal respirophasic variation, suggesting RA pressure 8 mmHg. - Pericardium, extracardiac: A trivial pericardial effusion was identified  posterior to the heart.  Impressions:  - Normal LV size with mild LV hypertrophy. EF 45-50% with basal to mid inferior and basal inferoseptal akinesis. The RV was mildly dilated with moderate systolic dysfunction, suggesting possible RV infarction in setting of inferior MI.   Treatments: See Hospital Course  Discharge Exam: Blood pressure 101/63, pulse 100, temperature 98.7 F (37.1 C), temperature source Oral, resp. rate 19, height 6' 4.5" (1.943 m), weight 470 lb 6.4 oz (213.372 kg), SpO2 97 %.   Disposition: Final discharge disposition not confirmed      Discharge Instructions    Amb Referral to Cardiac Rehabilitation    Complete by:  As directed   To [facility]     Diet - low sodium heart healthy    Complete by:  As directed      Increase activity slowly    Complete by:  As directed             Medication List    STOP taking these medications        glimepiride 4 MG tablet  Commonly known as:  AMARYL     labetalol 200 MG tablet  Commonly known as:  NORMODYNE      TAKE these medications        allopurinol 300 MG tablet  Commonly known as:  ZYLOPRIM  Take 300 mg by mouth daily.     aspirin 81 MG EC tablet  Take 1 tablet (81 mg total) by mouth daily.     atorvastatin 80 MG tablet  Commonly known as:  LIPITOR  Take 80 mg by mouth daily.     carvedilol 12.5 MG tablet  Commonly known as:  COREG  Take 1 tablet (12.5 mg total) by mouth 2 (two) times daily with a meal.     CINNAMON PLUS CHROMIUM PO  - Take 500 mg by mouth 2 (two) times daily. Contains Chromium 100 mcg  - Two tablets twice a day     CINNAMON PO  Take 1,000 mg by mouth.     citalopram 40 MG tablet  Commonly known as:  CELEXA  Take 40 mg by mouth daily.     clopidogrel 75 MG tablet  Commonly known as:  PLAVIX  Take 1 tablet (75 mg total) by mouth daily with breakfast.     Cranberry-Vitamin C-Vitamin E 4200-20-3 MG-MG-UNIT Caps  Take 2 capsules by mouth 2 (two) times daily.       ezetimibe 10 MG tablet  Commonly known as:  ZETIA  Take 10 mg by mouth daily.     furosemide 80 MG tablet  Commonly known as:  LASIX  Take 80 mg by mouth daily.     glucose monitoring kit monitoring kit  1 each by Does not apply route as needed for other.     hydrALAZINE 100 MG tablet  Commonly known as:  APRESOLINE  Take 100 mg by mouth 2 (two) times daily.     insulin aspart 100 UNIT/ML injection  Commonly known as:  novoLOG  Inject 10 Units into the skin 3 (three) times daily with meals.     insulin glargine 100 UNIT/ML injection  Commonly known as:  LANTUS  Inject 0.35 mLs (35 Units total) into the skin at bedtime.     Liraglutide 18 MG/3ML Sopn  Inject 0.3 mLs (1.8 mg total) into the skin daily.     spironolactone 25 MG tablet  Commonly known as:  ALDACTONE  Take 25 mg by mouth daily.     warfarin 10 MG tablet  Commonly known as:  COUMADIN  Take 10 mg by mouth daily at 6 PM.       Follow-up Information    Follow up with Rubbie Battiest, NP On 05/03/2014.   Specialty:  Nurse Practitioner   Why:  appt for 1pm but be there at 12:30pm   Contact information:   [address] [place] [address] [phone]       Follow up with [facility] On 04/30/2014.   Why:  for INR follow-up 3:00 pm    Contact information:   [address] [place] [address] [phone]      Follow up with Lyda Jester, PA-C On 05/08/2014.   Specialty:  Cardiology   Why:  8:00 am (cardiology follow-up w/ Dr. Allison Quarry PA).    Contact information:   [address] [place] [address] [phone]       TIME SPENT ON DISCHARGE, INCLUDING PHYSICIAN TIME: > 30 MINUTES  Signed: Lyda Jester 04/26/2014, 4:38 PM

## 2014-04-30 ENCOUNTER — Ambulatory Visit (INDEPENDENT_AMBULATORY_CARE_PROVIDER_SITE_OTHER): Payer: Medicare Other | Admitting: Pharmacist Clinician (PhC)/ Clinical Pharmacy Specialist

## 2014-04-30 DIAGNOSIS — Z7901 Long term (current) use of anticoagulants: Secondary | ICD-10-CM

## 2014-04-30 DIAGNOSIS — I482 Chronic atrial fibrillation, unspecified: Secondary | ICD-10-CM

## 2014-04-30 LAB — POCT INR: INR: 2.1

## 2014-05-03 ENCOUNTER — Ambulatory Visit (INDEPENDENT_AMBULATORY_CARE_PROVIDER_SITE_OTHER): Payer: Medicare Other | Admitting: Nurse Practitioner

## 2014-05-03 ENCOUNTER — Encounter: Payer: Self-pay | Admitting: Nurse Practitioner

## 2014-05-03 VITALS — BP 120/70 | HR 91 | Temp 97.9°F | Resp 16 | Ht 75.0 in | Wt >= 6400 oz

## 2014-05-03 DIAGNOSIS — Z794 Long term (current) use of insulin: Secondary | ICD-10-CM

## 2014-05-03 DIAGNOSIS — I2510 Atherosclerotic heart disease of native coronary artery without angina pectoris: Secondary | ICD-10-CM

## 2014-05-03 DIAGNOSIS — R21 Rash and other nonspecific skin eruption: Secondary | ICD-10-CM

## 2014-05-03 DIAGNOSIS — R944 Abnormal results of kidney function studies: Secondary | ICD-10-CM

## 2014-05-03 DIAGNOSIS — Z9861 Coronary angioplasty status: Secondary | ICD-10-CM

## 2014-05-03 DIAGNOSIS — Z7901 Long term (current) use of anticoagulants: Secondary | ICD-10-CM

## 2014-05-03 DIAGNOSIS — E119 Type 2 diabetes mellitus without complications: Secondary | ICD-10-CM

## 2014-05-03 NOTE — Patient Instructions (Signed)
Your last fasting glucose indicates you are at risk for developing diabetes, so I am checking an A1c today   I want you to lose 25 lbs over the next six months with a low glycemic index diet and regular exercise (30 minutes of cardio 5 days per week is your goal)  This is  my version of a  "Low GI"  Diet:  It will still lower your blood sugars and allow you to lose 4 to 8  lbs  per month if you follow it carefully.  Your goal with exercise is a minimum of 30 minutes of aerobic exercise 5 days per week (Walking does not count once it becomes easy!)     All of the foods can be found at grocery stores and in bulk at [employer].  The Atkins protein bars and shakes are available in more varieties at Target, WalMart and Lowe's Foods.     7 AM Breakfast:  Choose from the following:  Low carbohydrate Protein  Shakes (I recommend the EAS AdvantEdge "Carb Control" shakes  Or the low carb shakes by Atkins.    2.5 carbs   Arnold's "Sandwhich Thin"toasted  w/ peanut butter (no jelly: about 20 net carbs  "Bagel Thin" with cream cheese and salmon: about 20 carbs   a scrambled egg/bacon/cheese burrito made with Mission's "carb balance" whole wheat tortilla  (about 10 net carbs )  A slice of home made fritatta (egg based dish without a crust:  google it)    Avoid cereal and bananas, oatmeal and cream of wheat and grits. They are loaded with carbohydrates!   10 AM: high protein snack  Protein bar by Atkins (the snack size, under 200 cal, usually < 6 net carbs).    A stick of cheese:  Around 1 carb,  100 cal     Dannon Light n Fit Greek Yogurt  (80 cal, 8 carbs)  Other so called "protein bars" and Greek yogurts tend to be loaded with carbohydrates.  Remember, in food advertising, the word "energy" is synonymous for " carbohydrate."  Lunch:   A Sandwich using the bread choices listed, Can use any  Eggs,  lunchmeat, grilled meat or canned tuna), avocado, regular mayo/mustard  and cheese.  A Salad using blue  cheese, ranch,  Goddess or vinagrette,  No croutons or "confetti" and no "candied nuts" but regular nuts OK.   No pretzels or chips.  Pickles and miniature sweet peppers are a good low carb alternative that provide a "crunch"  The bread is the only source of carbohydrate in a sandwich and  can be decreased by trying some of these alternatives to traditional loaf bread  Joseph's makes a pita bread and a flat bread that are 50 cal and 4 net carbs available at BJs and WalMart.  This can be toasted to use with hummous as well  Toufayan makes a low carb flatbread that's 100 cal and 9 net carbs available at [employer] and Lowes  Mission makes 2 sizes of  Low carb whole wheat tortilla  (The large one is 210 cal and 6 net carbs)  Flat Out makes flatbreads that are low carb as well  Avoid "Low fat dressings, as well as [place] and [place] dressings They are loaded with sugar!   3 PM/ Mid day  Snack:  Consider  1 ounce of  almonds, walnuts, pistachios, pecans, peanuts,  Macadamia nuts or a nut medley.  Avoid "granola"; the dried cranberries and   raisins are loaded with carbohydrates. Mixed nuts as long as there are no raisins,  cranberries or dried fruit.    Try the prosciutto/mozzarella cheese sticks by Fiorruci  In deli /backery section   High protein   To avoid overindulging in snacks: Try drinking a glass of unsweeted almond/coconut milk  Or a cup of coffee with your Atkins chocolate bar to keep you from having 3!!!   Pork rinds!  Yes Pork Rinds        6 PM  Dinner:     Meat/fowl/fish with a green salad, and either broccoli, cauliflower, green beans, spinach, brussel sprouts or  Lima beans. DO NOT BREAD THE PROTEIN!!      There is a low carb pasta by Dreamfield's that is acceptable and tastes great: only 5 digestible carbs/serving.( All grocery stores but BJs carry it )  Try Michel Angelo's chicken piccata or chicken or eggplant parm over low carb pasta.(Lowes and BJs)   Aaron Sanchez's  "Carnitas" (pulled pork, no sauce,  0 carbs) or his beef pot roast to make a dinner burrito (at BJ's)  Pesto over low carb pasta (bj's sells a good quality pesto in the center refrigerated section of the deli   Try satueeing  Bok Choy with mushroooms  Whole wheat pasta is still full of digestible carbs and  Not as low in glycemic index as Dreamfield's.   Brown rice is still rice,  So skip the rice and noodles if you eat Chinese or Thai (or at least limit to 1/2 cup)  9 PM snack :   Breyer's "low carb" fudgsicle or  ice cream bar (Carb Smart line), or  Weight Watcher's ice cream bar , or another "no sugar added" ice cream;  a serving of fresh berries/cherries with whipped cream   Cheese or DANNON'S LlGHT N FIT GREEK YOGURT or the Oikos greek yogurt   8 ounces of [place] unsweetened almond/cococunut milk  Cheese and crackers (using WASA crackers,  They are low carb) or peanut butter on low carb crackers or pita bread     Avoid bananas, pineapple, grapes  and watermelon on a regular basis because they are high in sugar.  THINK OF THEM AS DESSERT  Remember that snack Substitutions should be less than 10 NET carbs per serving and meals should be < 25 net carbs. Remember that carbohydrates from fiber do not affect blood sugar, so you can  subtract fiber grams to get the "net carbs " of any particular food item.   

## 2014-05-03 NOTE — Progress Notes (Signed)
Subjective:    Patient ID: Chad Baker, male    DOB: [birth year], 49 y.o.   MRN: [id number]  HPI  Chad Baker is a 49 yo male establishing care.    1) Health Maintenance-   Diet- Heart healthy diet, low sodium, likes to cook   Exercise- Cardiac rehab to start soon    Walking 10 min 3 x a day   Immunizations- 01/05/2015 [facility]  Eye Exam- 2012  Dental Exam- 2015 tooth extraction   2) Chronic Problems-  Diabetes. FBS 144 this am   Stage 4 CKD   3) Acute Problems- Dizziness and chest pressure were the signs for his heart attack that he had at the beginning of this month. 2 stents were placed. D/c'd from [facility] 04/27/14.    Review of Systems  Constitutional: Negative for fever, chills, diaphoresis and fatigue.  Eyes: Negative for visual disturbance.  Respiratory: Negative for chest tightness, shortness of breath and wheezing.   Cardiovascular: Negative for chest pain, palpitations and leg swelling.  Gastrointestinal: Negative for nausea, vomiting, abdominal pain, diarrhea and abdominal distention.  Musculoskeletal: Negative for myalgias.  Skin: Positive for rash.       Abdomen- red and itchy  Neurological: Negative for dizziness, weakness, numbness and headaches.   Past Medical History  Diagnosis Date  . Atrial fibrillation     a. coumadin.  . Stroke     a. 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  Marland Kitchen HTN (hypertension)   . Hyperlipidemia   . CKD (chronic kidney disease), stage IV   . Diabetes mellitus   . OSA (obstructive sleep apnea)   . CAD (coronary artery disease)     a. 04/2014 Inf STEMI/PCI: LM nl, LAD 20, D1 nl, LCX 20p, RI nl, RCA 100p (5.0x28 Veriflex BMS), 80d (4.5x20 [name] BMS);  b. 04/2014 Echo: EF 45-50%.  . Super obesity     History   Social History  . Marital Status: Single    Spouse Name: N/A    Number of Children: N/A  . Years of Education: N/A   Occupational History  . Not on file.   Social History Main Topics  . Smoking  status: Never Smoker   . Smokeless tobacco: Not on file  . Alcohol Use: No  . Drug Use: No  . Sexual Activity:    Partners: Female     Comment: 1 partner    Other Topics Concern  . Not on file   Social History Narrative   Originally from [place], moved to [place]. Has girlfriend in [place].       Past Surgical History  Procedure Laterality Date  . Left heart catheterization with coronary angiogram N/A 04/24/2014    Procedure: LEFT HEART CATHETERIZATION WITH CORONARY ANGIOGRAM;  Surgeon: Jettie Booze, MD;  Location: [facility] CATH LAB;  Service: Cardiovascular;  Laterality: N/A;    Family History  Problem Relation Age of Onset  . Heart disease Father   . Heart disease Brother     Allergies  Allergen Reactions  . Codeine Nausea And Vomiting    Current Outpatient Prescriptions on File Prior to Visit  Medication Sig Dispense Refill  . allopurinol (ZYLOPRIM) 300 MG tablet Take 300 mg by mouth daily.    Marland Kitchen aspirin EC 81 MG EC tablet Take 1 tablet (81 mg total) by mouth daily.    Marland Kitchen atorvastatin (LIPITOR) 80 MG tablet Take 80 mg by mouth daily.    . carvedilol (  COREG) 12.5 MG tablet Take 1 tablet (12.5 mg total) by mouth 2 (two) times daily with a meal. 60 tablet 5  . Chromium-Cinnamon (CINNAMON PLUS CHROMIUM PO) Take 500 mg by mouth 2 (two) times daily. Contains Chromium 100 mcg Two tablets twice a day    . CINNAMON PO Take 1,000 mg by mouth.    . citalopram (CELEXA) 40 MG tablet Take 40 mg by mouth daily.    . clopidogrel (PLAVIX) 75 MG tablet Take 1 tablet (75 mg total) by mouth daily with breakfast. 30 tablet 5  . Cranberry-Vitamin C-Vitamin E 4200-20-3 MG-MG-UNIT CAPS Take 2 capsules by mouth 2 (two) times daily.     Marland Kitchen ezetimibe (ZETIA) 10 MG tablet Take 10 mg by mouth daily.    . furosemide (LASIX) 80 MG tablet Take 80 mg by mouth daily.    Marland Kitchen glucose monitoring kit (FREESTYLE) monitoring kit 1 each by Does not apply route as needed for other. 1 each 0  .  hydrALAZINE (APRESOLINE) 100 MG tablet Take 100 mg by mouth 2 (two) times daily.    . insulin aspart (NOVOLOG) 100 UNIT/ML injection Inject 10 Units into the skin 3 (three) times daily with meals. 10 mL 11  . insulin glargine (LANTUS) 100 UNIT/ML injection Inject 0.35 mLs (35 Units total) into the skin at bedtime. 10 mL 11  . Liraglutide 18 MG/3ML SOPN Inject 0.3 mLs (1.8 mg total) into the skin daily. 6 mL 1  . spironolactone (ALDACTONE) 25 MG tablet Take 25 mg by mouth daily.    Marland Kitchen warfarin (COUMADIN) 10 MG tablet Take 10 mg by mouth daily at 6 PM.      No current facility-administered medications on file prior to visit.      Objective:   Physical Exam  Constitutional: He is oriented to person, place, and time. He appears well-developed and well-nourished. No distress.  Severely obese BP 120/70 mmHg  Pulse 91  Temp(Src) 97.9 F (36.6 C) (Oral)  Resp 16  Ht 6' 3"  (1.905 m)  Wt 471 lb 1.9 oz (213.699 kg)  BMI 58.89 kg/m2  SpO2 97%   HENT:  Head: Normocephalic and atraumatic.  Right Ear: External ear normal.  Left Ear: External ear normal.  Eyes: Right eye exhibits no discharge. Left eye exhibits no discharge. No scleral icterus.  Cardiovascular: Normal rate, regular rhythm, normal heart sounds and intact distal pulses.  Exam reveals no gallop and no friction rub.   No murmur heard. Pulmonary/Chest: Effort normal and breath sounds normal. No respiratory distress. He has no wheezes. He has no rales. He exhibits no tenderness.  Musculoskeletal: Normal range of motion. He exhibits edema. He exhibits no tenderness.  Edema of LE  Neurological: He is alert and oriented to person, place, and time.  Skin: Skin is warm and dry. Rash noted. He is not diaphoretic.  Red, generalized, raised. Lower extremities are discolored from vascular issues related to diabetes and obesity.  Psychiatric: He has a normal mood and affect. His behavior is normal. Judgment and thought content normal.        Assessment & Plan:

## 2014-05-03 NOTE — Progress Notes (Signed)
Pre visit review using our clinic review tool, if applicable. No additional management support is needed unless otherwise documented below in the visit note. 

## 2014-05-06 DIAGNOSIS — N184 Chronic kidney disease, stage 4 (severe): Secondary | ICD-10-CM | POA: Insufficient documentation

## 2014-05-06 DIAGNOSIS — R21 Rash and other nonspecific skin eruption: Secondary | ICD-10-CM | POA: Insufficient documentation

## 2014-05-06 NOTE — Assessment & Plan Note (Signed)
Pt needs to be established with a Nephrologist in [place]. Amb referral to Nephrology placed. Lab Results  Component Value Date   CREATININE 2.60* 04/26/2014   BUN 37* 04/26/2014   NA 135 04/26/2014   K 4.3 04/26/2014   CL 100 04/26/2014   CO2 26 04/26/2014   Last GFR was 32 04/26/14

## 2014-05-06 NOTE — Assessment & Plan Note (Signed)
Is followed and set up at Coumadin clinic on [facility].

## 2014-05-06 NOTE — Assessment & Plan Note (Signed)
Pt believes rash was caused by changing of insulin in the hospital. Since he has been out he is on the insulin glargine 24 hr pen he states and his rash is improving.

## 2014-05-06 NOTE — Assessment & Plan Note (Addendum)
Stable. S/p STEMI in Jan. Pt is working on healthful habits. He is getting set up with Cardiac rehab and a nutritionist. He is motivated. Gave pt handout of low glycemic index diet. FU prn.

## 2014-05-08 ENCOUNTER — Ambulatory Visit (INDEPENDENT_AMBULATORY_CARE_PROVIDER_SITE_OTHER): Payer: Medicare Other | Admitting: Cardiology

## 2014-05-08 ENCOUNTER — Encounter: Payer: Self-pay | Admitting: Cardiology

## 2014-05-08 ENCOUNTER — Ambulatory Visit (INDEPENDENT_AMBULATORY_CARE_PROVIDER_SITE_OTHER): Payer: Medicare Other

## 2014-05-08 VITALS — BP 128/80 | HR 103 | Ht 75.0 in | Wt >= 6400 oz

## 2014-05-08 DIAGNOSIS — Z7901 Long term (current) use of anticoagulants: Secondary | ICD-10-CM

## 2014-05-08 DIAGNOSIS — N289 Disorder of kidney and ureter, unspecified: Secondary | ICD-10-CM

## 2014-05-08 DIAGNOSIS — I482 Chronic atrial fibrillation, unspecified: Secondary | ICD-10-CM

## 2014-05-08 DIAGNOSIS — Z9861 Coronary angioplasty status: Secondary | ICD-10-CM

## 2014-05-08 DIAGNOSIS — I2510 Atherosclerotic heart disease of native coronary artery without angina pectoris: Secondary | ICD-10-CM

## 2014-05-08 LAB — BASIC METABOLIC PANEL
BUN: 62 mg/dL — ABNORMAL HIGH (ref 6–23)
CO2: 20 mEq/L (ref 19–32)
Calcium: 8.8 mg/dL (ref 8.4–10.5)
Chloride: 101 mEq/L (ref 96–112)
Creat: 2.64 mg/dL — ABNORMAL HIGH (ref 0.50–1.35)
Glucose, Bld: 135 mg/dL — ABNORMAL HIGH (ref 70–99)
Potassium: 4.5 mEq/L (ref 3.5–5.3)
Sodium: 134 mEq/L — ABNORMAL LOW (ref 135–145)

## 2014-05-08 LAB — POCT INR: INR: 2.7

## 2014-05-08 MED ORDER — CARVEDILOL 25 MG PO TABS: 25.0000 mg | ORAL_TABLET | Freq: Two times a day (BID) | ORAL | Status: DC

## 2014-05-08 NOTE — Patient Instructions (Signed)
Brittainy Simmons, PA-C, has ordered the following test(s) to be done: 1. Blood work - Nutritional therapistBMET - to be done today  Your physician has recommended making the following medication changes: INCREASE Carvedilol to 25 mg daily. You make take 2 tablets twice daily until your current bottle runs out. When you pick up your new prescription follow those instructions listed on the bottle  Brittainy Simmons, PA-C, wants you to follow-up in 4 weeks with Dr Herbie BaltimoreHarding.

## 2014-05-08 NOTE — Progress Notes (Signed)
Cardiology Office Note   Date:  05/08/2014   ID:  Chad Baker [birth year], MRN [id number]  PCP:  Rubbie Battiest, NP  Cardiologist:  Dr. Ellyn Hack   Reason for visit/CC: Post hospital follow-up for CAD   History of Present Illness: Chad Baker is a 49 y.o. male who presents for post hospital follow-up after sustaining an inferior STEMI. His history is notable for severe morbid obesity, prior CVA (with hemorrhagic component) in 2000, hypertension, hyperlipidemia, chronic renal insufficiency with unknown baseline, type 2 diabetes mellitus poorly controlled, OSA and chronic atrial fibrillation, on warfarin therapy. He recently moved from [place] but did not establish care with a cardiologist on arrival to [facility].   He was transferred to [facility] from [facility] 04/24/14 for inferior STEMI after presenting to the emergency room there and found to have inferior ST elevations in leads II, III, aVF with appropriate reciprocal changes. He was also noted to be hyperglycemic with a blood sugar of greater than 600. Creatinine was 2.72. INR was subtherapeutic at 1.0. Due to his morbid obesity, he was too heavy for the [facility] cardiac catheterization lab, therefore he was transferred to [facility] for urgent cardiac catheterization. The procedure was performed by Dr. Ellyn Hack. He was found to have severe single-vessel CAD with thrombotic occlusion of the proximal RCA with the distal RCA lesion as well. Both sites were successfully treated with large bare-metal stents. TIMI-3 flow was restored. There was mild disease in the left coronary system (see cath note for full details). He tolerated the procedure well and left the cath lab in stable condition. A 2D echo was obtained and revealed mild LV dysfunction with EF of 45-50%. He was started on antiplatelet therapy with ASA + Plavix. Recommendations were given to continue triple therapy for one month (ASA + Plavix + warfarin) then discontinue ASA  and resume Plavix + warfarin. He was also started on a statin and BB. Decision was made to delay initiation of an ACE/ARB given renal insufficiency. Hydralazine was added for additional afterload reduction. He was continued on insulin therapy for his diabetes. He had no major complications and no recurrent CP. He did have renal insufficiency with Scr day of discharge at 2.60. Was instructed to get a repeat BMP on 04/30/14 however failed to get this done. He was scheduled for an INR check on 1/25.  Today in follow-up, he reports that he has done well since discharge from the hospital. He denies any recurrent chest pain. No dyspnea, palpitations, lightheadedness, dizziness, syncope/near-syncope. He reports full medication compliance with all prescribed medicines. He denies any abnormal bleeding while on triple therapy. On 04/30/2014 he was seen for the first time by our office pharmacist for INR check. He was therapeutic at 2.1. INR was rechecked today and remains therapeutic at 2.7. He has received dosing instructions from our pharmacist. This will continue to be followed by our office. Unfortunately, he has not yet established care with a PCP, but was given a list of recommendations prior discharge from the hospital. He states that he has been monitoring his blood glucose levels on a daily basis and reports that they have been decently controlled.   Past Medical History  Diagnosis Date  . Atrial fibrillation     a. coumadin.  . Stroke     a. 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  Marland Kitchen HTN (hypertension)   . Hyperlipidemia   . CKD (chronic kidney disease),  stage IV   . Diabetes mellitus   . OSA (obstructive sleep apnea)   . CAD (coronary artery disease)     a. 04/2014 Inf STEMI/PCI: LM nl, LAD 20, D1 nl, LCX 20p, RI nl, RCA 100p (5.0x28 Veriflex BMS), 80d (4.5x20 [name] BMS);  b. 04/2014 Echo: EF 45-50%.  . Super obesity     Past Surgical History  Procedure Laterality Date  . Left  heart catheterization with coronary angiogram N/A 04/24/2014    Procedure: LEFT HEART CATHETERIZATION WITH CORONARY ANGIOGRAM;  Surgeon: Jettie Booze, MD;  Location: [facility] CATH LAB;  Service: Cardiovascular;  Laterality: N/A;     Current Outpatient Prescriptions  Medication Sig Dispense Refill  . allopurinol (ZYLOPRIM) 300 MG tablet Take 300 mg by mouth daily.    Marland Kitchen aspirin EC 81 MG EC tablet Take 1 tablet (81 mg total) by mouth daily.    Marland Kitchen atorvastatin (LIPITOR) 80 MG tablet Take 80 mg by mouth daily.    . carvedilol (COREG) 25 MG tablet Take 1 tablet (25 mg total) by mouth 2 (two) times daily with a meal. 60 tablet 11  . Chromium-Cinnamon (CINNAMON PLUS CHROMIUM PO) Take 500 mg by mouth 2 (two) times daily. Contains Chromium 100 mcg Two tablets twice a day    . CINNAMON PO Take 1,000 mg by mouth.    . citalopram (CELEXA) 40 MG tablet Take 40 mg by mouth daily.    . clopidogrel (PLAVIX) 75 MG tablet Take 1 tablet (75 mg total) by mouth daily with breakfast. 30 tablet 5  . Cranberry-Vitamin C-Vitamin E 4200-20-3 MG-MG-UNIT CAPS Take 2 capsules by mouth 2 (two) times daily.     Marland Kitchen ezetimibe (ZETIA) 10 MG tablet Take 10 mg by mouth daily.    . furosemide (LASIX) 80 MG tablet Take 80 mg by mouth daily.    Marland Kitchen glimepiride (AMARYL) 4 MG tablet Take 4 mg by mouth 2 (two) times daily.    Marland Kitchen glucose monitoring kit (FREESTYLE) monitoring kit 1 each by Does not apply route as needed for other. 1 each 0  . hydrALAZINE (APRESOLINE) 100 MG tablet Take 100 mg by mouth 2 (two) times daily.    . insulin aspart (NOVOLOG) 100 UNIT/ML injection Inject 10 Units into the skin 3 (three) times daily with meals. 10 mL 11  . Liraglutide 18 MG/3ML SOPN Inject 0.3 mLs (1.8 mg total) into the skin daily. 6 mL 1  . spironolactone (ALDACTONE) 25 MG tablet Take 25 mg by mouth daily.    Marland Kitchen warfarin (COUMADIN) 10 MG tablet Take 10 mg by mouth daily at 6 PM.      No current facility-administered medications for this visit.     Allergies:   Codeine and Lantus    Social History:  The patient  reports that he has never smoked. He does not have any smokeless tobacco history on file. He reports that he does not drink alcohol or use illicit drugs.   Family History:  The patient's family history includes Heart disease in his brother and father.    ROS:  Please see the history of present illness.   Otherwise, review of systems are positive for none.   All other systems are reviewed and negative.    PHYSICAL EXAM: VS:  BP 128/80 mmHg  Pulse 103  Ht 6' 3"  (1.905 m)  Wt 467 lb 11.2 oz (212.147 kg)  BMI 58.46 kg/m2 , BMI Body mass index is 58.46 kg/(m^2). GEN: Well nourished, well developed, in  no acute distress HEENT: normal Neck: no JVD, carotid bruits, or masses Cardiac: RRR; no murmurs, rubs, or gallops,no edema  Respiratory:  clear to auscultation bilaterally, normal work of breathing GI: soft, nontender, nondistended, + BS MS: no deformity or atrophy Skin: warm and dry, no rash Neuro:  Strength and sensation are intact Psych: euthymic mood, full affect   EKG:  EKG is ordered today. The ekg ordered today demonstrates atrial fibrillaiton. HR 103 bpm.   Recent Labs: 04/24/2014: ALT 14 04/25/2014: Hemoglobin 12.5*; Platelets 206 04/26/2014: BUN 37*; Creatinine 2.60*; Potassium 4.3; Sodium 135    Lipid Panel    Component Value Date/Time   CHOL 180 04/24/2014 1540   TRIG 191* 04/24/2014 1540   HDL 30* 04/24/2014 1540   CHOLHDL 6.0 04/24/2014 1540   VLDL 38 04/24/2014 1540   LDLCALC 112* 04/24/2014 1540      Wt Readings from Last 3 Encounters:  05/08/14 467 lb 11.2 oz (212.147 kg)  05/03/14 471 lb 1.9 oz (213.699 kg)  04/26/14 470 lb 6.4 oz (213.372 kg)      Other studies Reviewed: Additional studies/ records that were reviewed today include: recent cath report and 2D echo report, hospital notes/discharge summary, labs. Review of the above records demonstrates: see above in  HPI   ASSESSMENT AND PLAN:  1.  CAD: 2 weeks status post inferior STEMI. Status post 2 site PCI plus bare-metal stenting to proximal and distal RCA. Denies recurrent anginal symptoms. Continue triple therapy with aspirin, Plavix and warfarin for 1 month. Recommendations were given to discontinue aspirin after 4 weeks and resume Plavix plus warfarin. He will need to remain on Plavix for preferably one year post stent placement.  Continue beta blocker therapy with carvedilol. Will increase to 25 mg twice a day given elevated heart rate of 103 bpm. Continue high dose statin therapy with Lipitor. Recommend follow-up lipid panel and LFTs when he follows up with Dr. Ellyn Hack in 4-6 weeks. Continue to hold ACE inhibitor therapy until renal function improves (ordered BMP today).  2. Chronic atrial fibrillation: Resting rate is elevated at 103 bpm. We'll further increase beta blocker, carvedilol, to 25 mg twice a day. Continue warfarin for stroke prophylaxis.  3. On warfarin therapy: INR therapeutic today at 2.7. Dosing instructions were provided by our office pharmacist.  4. Hyperlipidemia: Continue high dose statin therapy with Lipitor. Recommend follow-up lipid panel and LFTs when he follows up with Dr. Ellyn Hack in 4-6 weeks.   5. Hypertension: Blood pressure is well-controlled at 120/80. However we'll further increase carvedilol for better beta blockage given CAD with recent MI as well as chronic atrial fibrillation.  6. Type 2 diabetes: Continue current meds. Patient strongly encouraged to establish care with her PCP in the near future. Patient verbalized that he will schedule an appt with a new provider soon.  7. Morbid obesity: Streesed the importance of weight loss/heart healthy diet. Patient is interested in cardiac rehabilitation. I believe this will be a good way to introduce exercise into his daily routine.  8. Renal Insufficiency: Serum creatinine day of discharge was 2.60. Will recheck BMP  today to reevaluate renal function. We'll continue to hold ACE inhibitor therapy. We'll consider initiation of ACE inhibitor if improvement. Pt also notes that he has an appointment scheduled to see a kidney specialist in the next 2 weeks.      Current medicines are reviewed at length with the patient today.  The patient does not have concerns regarding medicines.  The following changes  have been made:  Increase Carvedilol to 25 mg BID. SL NTG ordered PRN.   Labs/ tests ordered today include: BMP  Orders Placed This Encounter  Procedures  . Basic metabolic panel  . EKG 12-Lead     Disposition:   FU with Dr. Ellyn Hack in 4 week   Signed, Lyda Jester, PA-C  05/08/2014 12:41 PM    [address], [place], [place]  [address] Phone: [phone]; Fax: [phone]

## 2014-05-18 ENCOUNTER — Telehealth: Payer: Self-pay | Admitting: *Deleted

## 2014-05-18 NOTE — Telephone Encounter (Signed)
Faxed - cardiac rehab physician orders for phase ll signed by Dr Herbie BaltimoreHarding.

## 2014-05-22 ENCOUNTER — Other Ambulatory Visit: Payer: Self-pay | Admitting: Nurse Practitioner

## 2014-05-22 MED ORDER — INSULIN LISPRO 100 UNIT/ML ~~LOC~~ SOLN: 10.0000 [IU] | Freq: Three times a day (TID) | SUBCUTANEOUS | Status: DC

## 2014-05-29 ENCOUNTER — Telehealth: Payer: Self-pay | Admitting: Cardiology

## 2014-05-29 MED ORDER — NITROGLYCERIN 0.4 MG SL SUBL: 0.4000 mg | SUBLINGUAL_TABLET | SUBLINGUAL | Status: AC | PRN

## 2014-05-29 NOTE — Telephone Encounter (Signed)
°  1. Which medications need to be refilled? Nitroglycerin-new prescription  2. [facility]  3. Do they need a 30 day or 90 day supply? 30  4. Would they like a call back once the medication has been sent to the pharmacy? yes

## 2014-05-29 NOTE — Telephone Encounter (Signed)
Spoke with pt, he is not having pain but was told when he left the hosp he would receive a script and did not. New script sent to the pharmacy. Patient voiced understanding to call with problems.

## 2014-06-04 DIAGNOSIS — Z7689 Persons encountering health services in other specified circumstances: Secondary | ICD-10-CM

## 2014-06-06 ENCOUNTER — Encounter (HOSPITAL_COMMUNITY): Payer: Medicare Other

## 2014-06-06 ENCOUNTER — Encounter: Payer: Self-pay | Admitting: Cardiology

## 2014-06-06 ENCOUNTER — Ambulatory Visit (INDEPENDENT_AMBULATORY_CARE_PROVIDER_SITE_OTHER): Payer: Medicare Other | Admitting: Cardiology

## 2014-06-06 ENCOUNTER — Ambulatory Visit (INDEPENDENT_AMBULATORY_CARE_PROVIDER_SITE_OTHER): Payer: Medicare Other | Admitting: *Deleted

## 2014-06-06 VITALS — BP 125/86 | HR 82 | Ht 75.0 in | Wt >= 6400 oz

## 2014-06-06 DIAGNOSIS — I482 Chronic atrial fibrillation, unspecified: Secondary | ICD-10-CM

## 2014-06-06 DIAGNOSIS — N529 Male erectile dysfunction, unspecified: Secondary | ICD-10-CM

## 2014-06-06 DIAGNOSIS — I10 Essential (primary) hypertension: Secondary | ICD-10-CM

## 2014-06-06 DIAGNOSIS — I8311 Varicose veins of right lower extremity with inflammation: Secondary | ICD-10-CM

## 2014-06-06 DIAGNOSIS — I2510 Atherosclerotic heart disease of native coronary artery without angina pectoris: Secondary | ICD-10-CM

## 2014-06-06 DIAGNOSIS — I4891 Unspecified atrial fibrillation: Secondary | ICD-10-CM

## 2014-06-06 DIAGNOSIS — N528 Other male erectile dysfunction: Secondary | ICD-10-CM

## 2014-06-06 DIAGNOSIS — I872 Venous insufficiency (chronic) (peripheral): Secondary | ICD-10-CM

## 2014-06-06 DIAGNOSIS — R600 Localized edema: Secondary | ICD-10-CM

## 2014-06-06 DIAGNOSIS — Z7901 Long term (current) use of anticoagulants: Secondary | ICD-10-CM

## 2014-06-06 DIAGNOSIS — N184 Chronic kidney disease, stage 4 (severe): Secondary | ICD-10-CM

## 2014-06-06 DIAGNOSIS — I2119 ST elevation (STEMI) myocardial infarction involving other coronary artery of inferior wall: Secondary | ICD-10-CM

## 2014-06-06 DIAGNOSIS — Z9861 Coronary angioplasty status: Secondary | ICD-10-CM

## 2014-06-06 DIAGNOSIS — I8312 Varicose veins of left lower extremity with inflammation: Secondary | ICD-10-CM

## 2014-06-06 DIAGNOSIS — E785 Hyperlipidemia, unspecified: Secondary | ICD-10-CM

## 2014-06-06 LAB — POCT INR: INR: 3.9

## 2014-06-06 MED ORDER — SILDENAFIL CITRATE 50 MG PO TABS: 50.0000 mg | ORAL_TABLET | Freq: Every day | ORAL | Status: DC | PRN

## 2014-06-06 NOTE — Assessment & Plan Note (Signed)
With baseline creatinines in the mid 2 range I do think he probably needs to have a nephrologist to follow-up with. He still makes urine and did not have any adverse effects and some contrast dye.

## 2014-06-06 NOTE — Assessment & Plan Note (Signed)
2 very large arteries and a very large vessel. He is on aspirin and Plavix and Coumadin currently. He is on high-dose beta blocker, high-dose statin along with hydralazine. He also takes spironolactone but is not on an ACE inhibitor or ARB due to his renal insufficiency.  Plan will be to stop aspirin now. Continue Plavix for minimum one year.

## 2014-06-06 NOTE — Patient Instructions (Addendum)
Your physician has recommended you make the following change in your medication:  Stop Aspirin  Start Viagra 50 mg once daily as needed (NO NOT TAKE WITH NITRO!!!!)  Your physician recommends that you return for lab work in:  Fasting Lipid and CMP in late April early May   You have been referred to cardiac rehab. They will call you with appt date and time.   Your physician wants you to follow-up in: Late June early July. You will receive a reminder letter in the mail two months in advance. If you don't receive a letter, please call our office to schedule the follow-up appointment.   You can resume normal activity at your 6 week mark

## 2014-06-06 NOTE — Assessment & Plan Note (Signed)
Using Ace wraps as compression hose. Continue diuresis. Monitor for signs of infection or lesions.

## 2014-06-06 NOTE — Assessment & Plan Note (Signed)
Rate controlled on beta blocker. Anticoagulated with warfarin. Asymptomatic. No signs of rapid heartbeats.

## 2014-06-06 NOTE — Progress Notes (Signed)
PCP: Rubbie Battiest, NP  Clinic Note: Chief Complaint  Patient presents with  . other    4 wk f/u no complaints. Meds reviewed verbally with pt.   HPI: Chad Baker is a 49 y.o. male with a PMH below who presents today for second hospital follow-up status post PCI inferior STEMI. He was transferred to [facility] emergently from [facility] ER because Cath Lab Table weight limit back on January 19.  He is a super morbidly obese gentleman with poorly controlled type 2 diabetes, OSA, hypertension and hyperlipidemia comes CK D and chronic atrial fibrillation who developed sudden onset jaw and chest pain with lightheadedness and dyspnea on the morning of January 19. He was taken to [facility] emergency room by EMS and was found to have acute injury. As elevations were somewhat subtle (likely related to body habitus) he is not had glucose greater than 600 and elevated creatinine level. He was transferred to come for evaluation and treatment and was brought urgently to the cardiac catheterization lab was found to have an occluded RCA. This was treated with thrombectomy and 2 very large bare-metal stents as indicated below. He had mildly reduced EF by echocardiogram but was discharged essentially an fast-track mode.  His initial hospital follow-up visit with Ms. Ellen Henri was on February 2. He was doing relatively well at that time with no major complaints. He has also followed up with his primary physician to ensure that his glycemic control is improved.  Past Medical History  Diagnosis Date  . Chronic a-fib     a. coumadin.  . Stroke     a. 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  Marland Kitchen Essential hypertension   . Hyperlipidemia with target LDL less than 70   . ST elevation myocardial infarction (STEMI) of inferior wall 04/2014    Transferred from [facility] due to patient's weight > [facility] Cath Table Limit (also with poor glycemic control  . CAD S/P percutaneous coronary angioplasty 04/2014    a. Inf  STEMI CATH: LM nl, LAD 20, D1 nl, LCX 20p, RI nl, RCA 100% pRCA (5.34m x294m - 6.32m432meriflex BMS), 80% dRCA (4.5mm37m0mm18m5.1 mm [name] BMS);  b. 04/2014 Echo: EF 45-50%. Basal-mid inferior akinesis with basal inferoseptal akinesis. Moderate LA dilation. RV systolic function moderately reduced with hypokinetic free wall.  . Super obesity   . OSA (obstructive sleep apnea)   . Poorly controlled type 2 diabetes mellitus with circulatory disorder   . CKD (chronic kidney disease), stage IV   . Bilateral lower extremity edema - severe 04/24/2014  . Family history of premature CAD 04/24/2014    Prior Cardiac Evaluation and Past Surgical History: Past Surgical History  Procedure Laterality Date  . Left heart catheterization with coronary angiogram N/A 04/24/2014    Procedure: LEFT HEART CATHETERIZATION WITH CORONARY ANGIOGRAM;  Surgeon: DavidGlenetta Hew [facility]; Very Large RCA -> pRCA 100%, dRCA 80%  . Percutaneous coronary stent intervention (pci-s)  04/24/2014    Aspiration Thrombectomy --> pRCA VeriFlex BMS 5.0 x 28 (6.1 mm), dRCA (@ crux) [name] BMS 4.5 x 20 (5.1 mm)   Interval History: Today he presents without any major complications or complaints. He denies any resting or exertional chest tightness or pressure that was consistent with his anginal type pain. He does have some exertional dyspnea but notes he has just started trying to do some walking recently because of the processing 72 his  MI. He has my for joint gym and he is actually lost several pounds since his hospital stay. He has chronic lower TIMI edema with venous stasis changes but notes that that is actually improved some as well. His energy level is improved. Denies any PND or orthopnea. No rapid irregular heartbeat sensation despite him being a chronic A. fib. He does not   notice the A. fib. He denies any syncope or near syncopal type symptoms besides on couple occasions feeling somewhat orthostatic. He has not had any TIA  or amaurosis fugax symptoms. No bleeding, occasions such as melena, hematochezia, hematuria or epistaxis. He does have some mild bruising issues.  ROS: A comprehensive was performed. Review of Systems  Constitutional: Negative for fever and malaise/fatigue.  HENT: Negative for nosebleeds.   Eyes: Negative for blurred vision.  Respiratory: Positive for shortness of breath (Exertional, but nothing like prior to his PCI.). Negative for cough, hemoptysis and wheezing.   Cardiovascular: Positive for leg swelling (chronic bilateral swelling with venous stasis changes). Negative for orthopnea and claudication.  Gastrointestinal: Negative for constipation, blood in stool and melena.  Genitourinary: Negative for dysuria and hematuria.  Musculoskeletal: Positive for back pain and falls (HE HAD ONE FALL WHERE HE MISSED THE BOTTOM STEP AND NOW HAS A LARGE BRUISE ON THE BACK WHERE HE LANDED.). Negative for myalgias.  Neurological: Negative for dizziness (Occasional orthostatic symptoms), seizures, loss of consciousness and headaches.  Endo/Heme/Allergies: Negative.   Psychiatric/Behavioral: Negative.  Negative for depression.  All other systems reviewed and are negative.   Current Outpatient Prescriptions on File Prior to Visit  Medication Sig Dispense Refill  . allopurinol (ZYLOPRIM) 300 MG tablet Take 300 mg by mouth daily.    Marland Kitchen atorvastatin (LIPITOR) 80 MG tablet Take 80 mg by mouth daily.    . carvedilol (COREG) 25 MG tablet Take 1 tablet (25 mg total) by mouth 2 (two) times daily with a meal. 60 tablet 11  . Chromium-Cinnamon (CINNAMON PLUS CHROMIUM PO) Take 500 mg by mouth 2 (two) times daily. Contains Chromium 100 mcg Two tablets twice a day    . CINNAMON PO Take 1,000 mg by mouth.    . citalopram (CELEXA) 40 MG tablet Take 40 mg by mouth daily.    . clopidogrel (PLAVIX) 75 MG tablet Take 1 tablet (75 mg total) by mouth daily with breakfast. 30 tablet 5  . Cranberry-Vitamin C-Vitamin E  4200-20-3 MG-MG-UNIT CAPS Take 2 capsules by mouth 2 (two) times daily.     Marland Kitchen ezetimibe (ZETIA) 10 MG tablet Take 10 mg by mouth daily.    . furosemide (LASIX) 80 MG tablet Take 80 mg by mouth daily.    Marland Kitchen glimepiride (AMARYL) 4 MG tablet Take 4 mg by mouth 2 (two) times daily.    Marland Kitchen glucose monitoring kit (FREESTYLE) monitoring kit 1 each by Does not apply route as needed for other. 1 each 0  . hydrALAZINE (APRESOLINE) 100 MG tablet Take 100 mg by mouth 2 (two) times daily.    . insulin lispro (HUMALOG) 100 UNIT/ML injection Inject 0.1 mLs (10 Units total) into the skin 3 (three) times daily with meals. 10 mL 11  . Liraglutide 18 MG/3ML SOPN Inject 0.3 mLs (1.8 mg total) into the skin daily. 6 mL 1  . nitroGLYCERIN (NITROSTAT) 0.4 MG SL tablet Place 1 tablet (0.4 mg total) under the tongue every 5 (five) minutes as needed for chest pain. 25 tablet 12  . spironolactone (ALDACTONE) 25 MG tablet Take 25  mg by mouth daily.    Marland Kitchen warfarin (COUMADIN) 10 MG tablet Take 10 mg by mouth daily at 6 PM.      No current facility-administered medications on file prior to visit.   Allergies  Allergen Reactions  . Codeine Nausea And Vomiting  . Lantus [Insulin Glargine] Itching and Rash    SOCIAL AND FAMILY HISTORY REVIEWED IN EPIC -- NO change He and his wife recently moved from [place] to the [place] area. He had not yet established care provider for primary care prior to his MI. He is disabled following his stroke with hemorrhagic version several years ago.   Wt Readings from Last 3 Encounters:  06/06/14 463 lb 8 oz (210.242 kg)  05/08/14 467 lb 11.2 oz (212.147 kg)  05/03/14 471 lb 1.9 oz (213.699 kg)    PHYSICAL EXAM BP 125/86 mmHg  Pulse 82  Ht 6' 3"  (1.905 m)  Wt 463 lb 8 oz (210.242 kg)  BMI 57.93 kg/m2 General appearance: alert, cooperative, appears stated age, no distress and  super morbidly obese Neck: no adenopathy, no carotid bruit and no JVD WAS NOT DETECTABLE DUE TO BODY  HABITUS  Lungs:  CTA B normal percussion bilaterally and non-labored Heart:  Irregular/irregular rhythm with normal rate; distant heart sounds but normal S1 and S2;, no murmur, click, rub or gallop; unable to palpate PMI Abdomen: soft, non-tender; bowel sounds normal; no masses,  no organomegaly;  unable to palpate HSM Extremities: extremities normal, atraumatic, no cyanosis, and  bilateral 2-3+ LAE with diffuse venous stasis discoloration. Pulses: 2+ and symmetric;  radial pulses, but pedal pulses difficult to palpate due to edema. Skin: notable venous stasis changes in the lower extremities but otherwise no significant rashes or lesions.  Neurologic: Mental status: Alert, oriented, thought content appropriate; Cranial nerves: normal (II-XII grossly intact)   Adult ECG Report  Rate:  83 ;  Rhythm: atrial fibrillation and Controlled ventricular response. There is poor R-wave progression in anterior leads likely related to body habitus but cannot exclude anterior infarct, age undetermined. Generalized low voltage.  Narrative Interpretation:  Relatively stable EKG  Recent Labs:     Chemistry      Component Value Date/Time   NA 134* 05/08/2014 1058   K 4.5 05/08/2014 1058   CL 101 05/08/2014 1058   CO2 20 05/08/2014 1058   BUN 62* 05/08/2014 1058   CREATININE 2.64* 05/08/2014 1058   CREATININE 2.60* 04/26/2014 1104      Component Value Date/Time   CALCIUM 8.8 05/08/2014 1058   ALKPHOS 205* 04/24/2014 1540   AST 13 04/24/2014 1540   ALT 14 04/24/2014 1540   BILITOT 0.7 04/24/2014 1540      ASSESSMENT / PLAN: ST elevation myocardial infarction (STEMI) of inferior wall, subsequent episode of care No recurrent symptoms. Almost 6 weeks out. He should be ready to go to full activity within the next week or so. This includes sexual activity. Refer to cardiac rehabilitation. He has a mild to moderately reduced EF but with no real heart failure symptoms. Would recheck echo by the end of the  year in order to get a new baseline.   CAD S/P 2 SITE BMS PCI TO PROX & DISTAL RCA -  2 very large arteries and a very large vessel. He is on aspirin and Plavix and Coumadin currently. He is on high-dose beta blocker, high-dose statin along with hydralazine. He also takes spironolactone but is not on an ACE inhibitor or ARB due to his renal  insufficiency.  Plan will be to stop aspirin now. Continue Plavix for minimum one year.   Chronic atrial fibrillation Rate controlled on beta blocker. Anticoagulated with warfarin. Asymptomatic. No signs of rapid heartbeats.   Essential hypertension Well-controlled here today on current regimen. I would be hesitant to shoot for better control at this point, as this would likely require additional agents --not sure how much more we can increase the beta blocker with his resting heart rate..   Bilateral lower extremity edema - severe Edema actually looks a little bit better today. I recommended using Ace wraps or compression as I don't think he would be able to pull compression stockings over his ankles. Is on Lasix. Overall seems to have improved.   Chronic venous stasis dermatitis of both lower extremities Using Ace wraps as compression hose. Continue diuresis. Monitor for signs of infection or lesions.   Chronic kidney disease, stage IV (severe) With baseline creatinines in the mid 2 range I do think he probably needs to have a nephrologist to follow-up with. He still makes urine and did not have any adverse effects and some contrast dye.   Erectile dysfunction of organic origin In the past he was on Cialis. With him being a cardiac patient and potentially needing when necessary nitroglycerin, we will switch him to when necessary Viagra. He will need to monitor himself for hypotension.   Hyperlipidemia with target LDL less than 70 His peri-MI lipids were not adequately controlled with total cholesterol 180 and LDL 112. He is now in the care  of his primary care doctor who will adjust medications accordingly. Apparently his sugars and been doing quite well at home.    Stop Aspirin  Start Viagra 50 mg once daily as needed (NO NOT [place]!!!!)  Your physician recommends that you return for lab work in:  Fasting Lipid and CMP in late April early May   Refer to cardiac rehab. They will call you with appt date and time.     Orders Placed This Encounter  Procedures  . Comp Met (CMET)    Standing Status: Future     Number of Occurrences:      Standing Expiration Date: 06/06/2015  . Lipid panel    Standing Status: Future     Number of Occurrences:      Standing Expiration Date: 06/06/2015  . AMB referral to cardiac rehabilitation    Referral Priority:  Routine    Referral Type:  Consultation    Number of Visits Requested:  1  . EKG 12-Lead    Order Specific Question:  Where should this test be performed    Answer:  [facility]   Meds ordered this encounter  Medications  . sildenafil (VIAGRA) 50 MG tablet    Sig: Take 1 tablet (50 mg total) by mouth daily as needed for erectile dysfunction.    Dispense:  10 tablet    Refill:  0     Followup: late June-early July   Leonie Man, M.D., M.S. Interventional Cardiologist   Pager # [phone]

## 2014-06-06 NOTE — Assessment & Plan Note (Addendum)
Well-controlled here today on current regimen. I would be hesitant to shoot for better control at this point, as this would likely require additional agents --not sure how much more we can increase the beta blocker with his resting heart rate..Marland Kitchen

## 2014-06-06 NOTE — Assessment & Plan Note (Signed)
Edema actually looks a little bit better today. I recommended using Ace wraps or compression as I don't think he would be able to pull compression stockings over his ankles. Is on Lasix. Overall seems to have improved.

## 2014-06-06 NOTE — Assessment & Plan Note (Addendum)
No recurrent symptoms. Almost 6 weeks out. He should be ready to go to full activity within the next week or so. This includes sexual activity. Refer to cardiac rehabilitation. He has a mild to moderately reduced EF but with no real heart failure symptoms. Would recheck echo by the end of the year in order to get a new baseline.

## 2014-06-06 NOTE — Assessment & Plan Note (Signed)
His peri-MI lipids were not adequately controlled with total cholesterol 180 and LDL 112. He is now in the care of his primary care doctor who will adjust medications accordingly. Apparently his sugars and been doing quite well at home.

## 2014-06-06 NOTE — Assessment & Plan Note (Signed)
In the past he was on Cialis. With him being a cardiac patient and potentially needing when necessary nitroglycerin, we will switch him to when necessary Viagra. He will need to monitor himself for hypotension.

## 2014-06-12 ENCOUNTER — Ambulatory Visit: Payer: Medicare Other | Admitting: Cardiology

## 2014-06-15 ENCOUNTER — Telehealth: Payer: Self-pay | Admitting: *Deleted

## 2014-06-15 NOTE — Telephone Encounter (Signed)
Cardiac rehab orders faxed.

## 2014-06-18 ENCOUNTER — Ambulatory Visit: Payer: Medicare Other | Admitting: Cardiology

## 2014-06-19 ENCOUNTER — Encounter: Payer: Self-pay | Admitting: Nurse Practitioner

## 2014-06-19 ENCOUNTER — Ambulatory Visit (INDEPENDENT_AMBULATORY_CARE_PROVIDER_SITE_OTHER): Payer: Medicare Other | Admitting: Nurse Practitioner

## 2014-06-19 VITALS — BP 110/66 | HR 97 | Temp 98.2°F | Resp 16 | Ht 75.0 in | Wt >= 6400 oz

## 2014-06-19 DIAGNOSIS — E119 Type 2 diabetes mellitus without complications: Secondary | ICD-10-CM

## 2014-06-19 DIAGNOSIS — Z0000 Encounter for general adult medical examination without abnormal findings: Secondary | ICD-10-CM

## 2014-06-19 DIAGNOSIS — Z7901 Long term (current) use of anticoagulants: Secondary | ICD-10-CM

## 2014-06-19 DIAGNOSIS — I10 Essential (primary) hypertension: Secondary | ICD-10-CM

## 2014-06-19 DIAGNOSIS — N184 Chronic kidney disease, stage 4 (severe): Secondary | ICD-10-CM

## 2014-06-19 DIAGNOSIS — Z794 Long term (current) use of insulin: Secondary | ICD-10-CM

## 2014-06-19 NOTE — Progress Notes (Signed)
Pre visit review using our clinic review tool, if applicable. No additional management support is needed unless otherwise documented below in the visit note. 

## 2014-06-19 NOTE — Patient Instructions (Signed)

## 2014-06-19 NOTE — Progress Notes (Signed)
Subjective:    Patient ID: Chad Baker, male    DOB: [birth year], 49 y.o.   MRN: [id number]  HPI  [name] is a 49 yo male here for his annual exam.   Saw Chad Baker- Kidney had [place] Chad Baker- saw in early March   1) Health Maintenance-   Diet- Trying to eat healthier   Makes food at home, when he eats out he researches the food before going   Exercise- Joined a gym  Immunizations-Needs tdap  Dental Exam- A long time ago, 2014 tooth pulled   2) Chronic Problems-  CAD- S/p MI- initiating cardiac rehab. Sees Chad Baker and PA.   DM Type II- Needs a separate visit to discuss all DM info and regimen. Working on diet and exercise, compliant on medications.   Venus stasis of LE- Chad Baker suggested wrapping legs with ACE, continue diuresis.   Chronic stage 4 kidney disease- Sees Chad Baker.   ED- Viagra script; knows not to take with Nitro  Severe Obesity- Working on diet and exercise. Currently in cardiac rehab and seeing a dietician.   3) Acute Problems-  None today, no need for refills.    Review of Systems  Constitutional: Negative for fever, chills, diaphoresis, activity change, appetite change, fatigue and unexpected weight change.  HENT: Negative for tinnitus and trouble swallowing.   Eyes: Negative for visual disturbance.  Respiratory: Negative for chest tightness, shortness of breath and wheezing.   Cardiovascular: Negative for chest pain, palpitations and leg swelling.  Gastrointestinal: Negative for nausea, vomiting, diarrhea and constipation.  Endocrine: Negative for polydipsia, polyphagia and polyuria.  Genitourinary: Negative for dysuria, discharge, penile pain and testicular pain.  Musculoskeletal: Negative for myalgias and back pain.  Skin: Positive for color change. Negative for rash.       Chronic color changes to lower legs  Neurological: Negative for dizziness, weakness, numbness and headaches.  Psychiatric/Behavioral: Negative for suicidal ideas and sleep  disturbance. The patient is not nervous/anxious.    Past Medical History  Diagnosis Date  . Chronic a-fib     a. coumadin.  . Stroke     a. 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  Marland Kitchen Essential hypertension   . Hyperlipidemia with target LDL less than 70   . ST elevation myocardial infarction (STEMI) of inferior wall 04/2014    Transferred from [facility] due to patient's weight > [facility] Cath Table Limit (also with poor glycemic control  . CAD S/P percutaneous coronary angioplasty 04/2014    a. Inf STEMI CATH: LM nl, LAD 20, D1 nl, LCX 20p, RI nl, RCA 100% pRCA (5.84m x236m - 6.7m58meriflex BMS), 80% dRCA (4.5mm85m0mm64m5.1 mm [name] BMS);  b. 04/2014 Echo: EF 45-50%. Basal-mid inferior akinesis with basal inferoseptal akinesis. Moderate LA dilation. RV systolic function moderately reduced with hypokinetic free wall.  . Super obesity   . OSA (obstructive sleep apnea)   . Poorly controlled type 2 diabetes mellitus with circulatory disorder   . CKD (chronic kidney disease), stage IV   . Bilateral lower extremity edema - severe 04/24/2014  . Family history of premature CAD 04/24/2014    History   Social History  . Marital Status: Single    Spouse Name: N/A  . Number of Children: N/A  . Years of Education: N/A   Occupational History  . Not on file.   Social History Main Topics  . Smoking status: Never Smoker   .  Smokeless tobacco: Not on file  . Alcohol Use: No  . Drug Use: No  . Sexual Activity:    Partners: Female     Comment: 1 partner    Other Topics Concern  . Not on file   Social History Narrative   Originally from [place], moved to [place]. Has girlfriend in [place].       Past Surgical History  Procedure Laterality Date  . Left heart catheterization with coronary angiogram N/A 04/24/2014    Procedure: LEFT HEART CATHETERIZATION WITH CORONARY ANGIOGRAM;  Surgeon: Glenetta Hew, MD, Location: [facility] CATH LAB; Very Large RCA -> pRCA 100%, dRCA 80%  .  Percutaneous coronary stent intervention (pci-s)  04/24/2014    Aspiration Thrombectomy --> pRCA VeriFlex BMS 5.0 x 28 (6.1 mm), dRCA (@ crux) [name] BMS 4.5 x 20 (5.1 mm)    Family History  Problem Relation Age of Onset  . Heart disease Father   . Heart disease Brother     Allergies  Allergen Reactions  . Codeine Nausea And Vomiting  . Lantus [Insulin Glargine] Itching and Rash    Current Outpatient Prescriptions on File Prior to Visit  Medication Sig Dispense Refill  . allopurinol (ZYLOPRIM) 300 MG tablet Take 300 mg by mouth daily.    Marland Kitchen atorvastatin (LIPITOR) 80 MG tablet Take 80 mg by mouth daily.    . carvedilol (COREG) 25 MG tablet Take 1 tablet (25 mg total) by mouth 2 (two) times daily with a meal. 60 tablet 11  . Chromium-Cinnamon (CINNAMON PLUS CHROMIUM PO) Take 500 mg by mouth 2 (two) times daily. Contains Chromium 100 mcg Two tablets twice a day    . citalopram (CELEXA) 40 MG tablet Take 40 mg by mouth daily.    . clopidogrel (PLAVIX) 75 MG tablet Take 1 tablet (75 mg total) by mouth daily with breakfast. 30 tablet 5  . Cranberry-Vitamin C-Vitamin E 4200-20-3 MG-MG-UNIT CAPS Take 2 capsules by mouth 2 (two) times daily.     Marland Kitchen ezetimibe (ZETIA) 10 MG tablet Take 10 mg by mouth daily.    . furosemide (LASIX) 80 MG tablet Take 80 mg by mouth daily.    Marland Kitchen glimepiride (AMARYL) 4 MG tablet Take 4 mg by mouth 2 (two) times daily.    Marland Kitchen glucose monitoring kit (FREESTYLE) monitoring kit 1 each by Does not apply route as needed for other. 1 each 0  . hydrALAZINE (APRESOLINE) 100 MG tablet Take 100 mg by mouth 2 (two) times daily.    . insulin lispro (HUMALOG) 100 UNIT/ML injection Inject 0.1 mLs (10 Units total) into the skin 3 (three) times daily with meals. 10 mL 11  . Liraglutide 18 MG/3ML SOPN Inject 0.3 mLs (1.8 mg total) into the skin daily. 6 mL 1  . nitroGLYCERIN (NITROSTAT) 0.4 MG SL tablet Place 1 tablet (0.4 mg total) under the tongue every 5 (five) minutes as needed for  chest pain. 25 tablet 12  . sildenafil (VIAGRA) 50 MG tablet Take 1 tablet (50 mg total) by mouth daily as needed for erectile dysfunction. 10 tablet 0  . spironolactone (ALDACTONE) 25 MG tablet Take 25 mg by mouth daily.    Marland Kitchen warfarin (COUMADIN) 10 MG tablet Take 10 mg by mouth daily at 6 PM.      No current facility-administered medications on file prior to visit.       Objective:   Physical Exam  Constitutional: He is oriented to person, place, and time. He appears well-developed and well-nourished. No  distress.  BP 110/66 mmHg  Pulse 97  Temp(Src) 98.2 F (36.8 C) (Oral)  Resp 16  Ht 6' 3"  (1.905 m)  Wt 467 lb 12.8 oz (212.193 kg)  BMI 58.47 kg/m2  SpO2 98%   HENT:  Head: Normocephalic and atraumatic.  Right Ear: External ear normal.  Left Ear: External ear normal.  Eyes: EOM are normal. Pupils are equal, round, and reactive to light. Right eye exhibits no discharge. Left eye exhibits no discharge. No scleral icterus.  Neck: Normal range of motion. Neck supple. No thyromegaly present.  Cardiovascular: Normal rate, regular rhythm, normal heart sounds and intact distal pulses.  Exam reveals no gallop and no friction rub.   No murmur heard. Pulmonary/Chest: Effort normal and breath sounds normal. No respiratory distress. He has no wheezes. He has no rales. He exhibits no tenderness.  Abdominal: Soft. Bowel sounds are normal. He exhibits no distension and no mass. There is no tenderness. There is no rebound and no guarding.  Morbidly obese, multiple striae on abdomen  Musculoskeletal: He exhibits edema. He exhibits no tenderness.  Lower extremities edematous and color change  Lymphadenopathy:    He has no cervical adenopathy.  Neurological: He is alert and oriented to person, place, and time.  Skin: Skin is warm and dry. No rash noted. He is not diaphoretic.  Psychiatric: He has a normal mood and affect. His behavior is normal. Judgment and thought content normal.        Assessment & Plan:

## 2014-06-20 ENCOUNTER — Ambulatory Visit (INDEPENDENT_AMBULATORY_CARE_PROVIDER_SITE_OTHER): Payer: Medicare Other | Admitting: Pharmacist

## 2014-06-20 DIAGNOSIS — I482 Chronic atrial fibrillation, unspecified: Secondary | ICD-10-CM

## 2014-06-20 DIAGNOSIS — Z7901 Long term (current) use of anticoagulants: Secondary | ICD-10-CM

## 2014-06-20 LAB — POCT INR: INR: 4.6

## 2014-06-24 DIAGNOSIS — Z0000 Encounter for general adult medical examination without abnormal findings: Secondary | ICD-10-CM | POA: Insufficient documentation

## 2014-06-24 NOTE — Assessment & Plan Note (Signed)
Discussed acute and chronic issues. Reviewed health maintenance measures, PFSHx, and immunizations. Left before getting Tdap today. Will get in May.

## 2014-06-24 NOTE — Assessment & Plan Note (Signed)
Need to have follow up in May regarding diabetes. Encouraged eye and dental exams. He currently cuts his own toenails. Asked him to check for wounds. Report any signs of yeast in folded areas of skin.  Lab Results  Component Value Date   HGBA1C 14.8* 04/24/2014

## 2014-06-24 NOTE — Assessment & Plan Note (Signed)
Followed by Coumadin clinic 

## 2014-06-24 NOTE — Assessment & Plan Note (Signed)
Pt making healthier choices and starting cardiac rehab. Weight is 4 lbs up from last visit.   Wt Readings from Last 3 Encounters:  06/19/14 467 lb 12.8 oz (212.193 kg)  06/06/14 463 lb 8 oz (210.242 kg)  05/08/14 467 lb 11.2 oz (212.147 kg)

## 2014-06-24 NOTE — Assessment & Plan Note (Signed)
Nephrologist- Dr. Cherylann RatelLateef.

## 2014-06-24 NOTE — Assessment & Plan Note (Signed)
Compliant on current regimen.  BP Readings from Last 3 Encounters:  06/19/14 110/66  06/06/14 125/86  05/08/14 128/80   BMET- recent.

## 2014-06-27 ENCOUNTER — Ambulatory Visit (INDEPENDENT_AMBULATORY_CARE_PROVIDER_SITE_OTHER): Payer: Medicare Other

## 2014-06-27 DIAGNOSIS — Z7901 Long term (current) use of anticoagulants: Secondary | ICD-10-CM

## 2014-06-27 DIAGNOSIS — I482 Chronic atrial fibrillation, unspecified: Secondary | ICD-10-CM

## 2014-06-27 LAB — POCT INR: INR: 2.4

## 2014-07-11 ENCOUNTER — Ambulatory Visit (INDEPENDENT_AMBULATORY_CARE_PROVIDER_SITE_OTHER): Payer: Medicare Other

## 2014-07-11 DIAGNOSIS — Z7901 Long term (current) use of anticoagulants: Secondary | ICD-10-CM | POA: Diagnosis not present

## 2014-07-11 DIAGNOSIS — I482 Chronic atrial fibrillation, unspecified: Secondary | ICD-10-CM

## 2014-07-11 LAB — POCT INR: INR: 1.8

## 2014-08-01 ENCOUNTER — Ambulatory Visit (INDEPENDENT_AMBULATORY_CARE_PROVIDER_SITE_OTHER): Payer: Medicare Other | Admitting: *Deleted

## 2014-08-01 DIAGNOSIS — I482 Chronic atrial fibrillation, unspecified: Secondary | ICD-10-CM

## 2014-08-01 DIAGNOSIS — Z7901 Long term (current) use of anticoagulants: Secondary | ICD-10-CM | POA: Diagnosis not present

## 2014-08-01 LAB — POCT INR: INR: 3.1

## 2014-08-08 ENCOUNTER — Encounter: Payer: Self-pay | Admitting: Nurse Practitioner

## 2014-08-08 ENCOUNTER — Ambulatory Visit (INDEPENDENT_AMBULATORY_CARE_PROVIDER_SITE_OTHER): Payer: Medicare Other | Admitting: Nurse Practitioner

## 2014-08-08 VITALS — BP 102/68 | HR 89 | Temp 98.4°F | Ht 75.0 in | Wt >= 6400 oz

## 2014-08-08 DIAGNOSIS — M25512 Pain in left shoulder: Secondary | ICD-10-CM | POA: Insufficient documentation

## 2014-08-08 MED ORDER — CITALOPRAM HYDROBROMIDE 40 MG PO TABS: 40.0000 mg | ORAL_TABLET | Freq: Every day | ORAL | Status: DC

## 2014-08-08 MED ORDER — GLIMEPIRIDE 4 MG PO TABS: 4.0000 mg | ORAL_TABLET | Freq: Two times a day (BID) | ORAL | Status: DC

## 2014-08-08 MED ORDER — TRAMADOL-ACETAMINOPHEN 37.5-325 MG PO TABS: 1.0000 | ORAL_TABLET | Freq: Two times a day (BID) | ORAL | Status: DC

## 2014-08-08 MED ORDER — ATORVASTATIN CALCIUM 80 MG PO TABS: 80.0000 mg | ORAL_TABLET | Freq: Every day | ORAL | Status: DC

## 2014-08-08 NOTE — Patient Instructions (Signed)

## 2014-08-08 NOTE — Progress Notes (Signed)
Subjective:    Patient ID: Chad Baker, male    DOB: [birth year], 49 y.o.   MRN: [id number]  HPI  Chad Baker is a 49 yo male with a CC of shoulder pain right hand dominant.   1) Pt reports [place] at [facility] playing putt putt and he tripped over a brick and fell. When he did he hit his head and hurt his shoulder. He reports hearing a loud pop of his left arm and they called EMS. He was taken to a hospital in [facility] and x-rayed. There was no obvious dislocation or fracture he reports.   Today he feels it is worse pain and less ROM than originally after the fall. Sharp pain worse with sleeping and getting out of bed. He uses left hand to drive and shoot pool, despite being right hand dominant. Intermittent pain.   Review of Systems  Constitutional: Negative for fever, chills, diaphoresis and fatigue.  Eyes: Negative for visual disturbance.  Respiratory: Negative for chest tightness, shortness of breath and wheezing.   Cardiovascular: Negative for chest pain, palpitations and leg swelling.  Gastrointestinal: Negative for nausea, vomiting and diarrhea.  Musculoskeletal: Positive for arthralgias. Negative for myalgias, back pain, joint swelling, gait problem, neck pain and neck stiffness.       Left shoulder pain  Skin: Positive for wound. Negative for rash.       Left shoulder abraision  Neurological: Negative for dizziness, weakness and numbness.  Psychiatric/Behavioral: The patient is not nervous/anxious.    Past Medical History  Diagnosis Date  . Chronic a-fib     a. coumadin.  . Stroke     a. 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  Marland Kitchen Essential hypertension   . Hyperlipidemia with target LDL less than 70   . ST elevation myocardial infarction (STEMI) of inferior wall 04/2014    Transferred from [facility] due to patient's weight > [facility] Cath Table Limit (also with poor glycemic control  . CAD S/P percutaneous coronary angioplasty 04/2014    a. Inf STEMI  CATH: LM nl, LAD 20, D1 nl, LCX 20p, RI nl, RCA 100% pRCA (5.34m x242m - 6.11m12meriflex BMS), 80% dRCA (4.5mm3m0mm93m5.1 mm [name] BMS);  b. 04/2014 Echo: EF 45-50%. Basal-mid inferior akinesis with basal inferoseptal akinesis. Moderate LA dilation. RV systolic function moderately reduced with hypokinetic free wall.  . Super obesity   . OSA (obstructive sleep apnea)   . Poorly controlled type 2 diabetes mellitus with circulatory disorder   . CKD (chronic kidney disease), stage IV   . Bilateral lower extremity edema - severe 04/24/2014  . Family history of premature CAD 04/24/2014    History   Social History  . Marital Status: Single    Spouse Name: N/A  . Number of Children: N/A  . Years of Education: N/A   Occupational History  . Not on file.   Social History Main Topics  . Smoking status: Never Smoker   . Smokeless tobacco: Not on file  . Alcohol Use: No  . Drug Use: No  . Sexual Activity:    Partners: Female     Comment: 1 partner    Other Topics Concern  . Not on file   Social History Narrative   [place]   Past Surgical History  Procedure Laterality Date  . Left heart catheterization  with coronary angiogram N/A 04/24/2014    Procedure: LEFT HEART CATHETERIZATION WITH CORONARY ANGIOGRAM;  Surgeon: David Harding, MD, Location: [facility] CATH LAB; Very Large RCA -> pRCA 100%, dRCA 80%  . Percutaneous coronary stent intervention (pci-s)  04/24/2014    Aspiration Thrombectomy --> pRCA VeriFlex BMS 5.0 x 28 (6.1 mm), dRCA (@ crux) [name] BMS 4.5 x 20 (5.1 mm)    Family History  Problem Relation Age of Onset  . Heart disease Father   . Heart disease Brother     Allergies  Allergen Reactions  . Codeine Nausea And Vomiting  . Lantus [Insulin Glargine] Itching and Rash    Current Outpatient Prescriptions on File Prior to Visit  Medication Sig Dispense Refill  . allopurinol (ZYLOPRIM) 300 MG tablet  Take 300 mg by mouth daily.    . carvedilol (COREG) 25 MG tablet Take 1 tablet (25 mg total) by mouth 2 (two) times daily with a meal. 60 tablet 11  . Chromium-Cinnamon (CINNAMON PLUS CHROMIUM PO) Take 500 mg by mouth 2 (two) times daily. Contains Chromium 100 mcg Two tablets twice a day    . clopidogrel (PLAVIX) 75 MG tablet Take 1 tablet (75 mg total) by mouth daily with breakfast. 30 tablet 5  . Cranberry-Vitamin C-Vitamin E 4200-20-3 MG-MG-UNIT CAPS Take 2 capsules by mouth 2 (two) times daily.     . ezetimibe (ZETIA) 10 MG tablet Take 10 mg by mouth daily.    . furosemide (LASIX) 80 MG tablet Take 80 mg by mouth daily.    . glucose monitoring kit (FREESTYLE) monitoring kit 1 each by Does not apply route as needed for other. 1 each 0  . hydrALAZINE (APRESOLINE) 100 MG tablet Take 100 mg by mouth 2 (two) times daily.    . insulin lispro (HUMALOG) 100 UNIT/ML injection Inject 0.1 mLs (10 Units total) into the skin 3 (three) times daily with meals. 10 mL 11  . Liraglutide 18 MG/3ML SOPN Inject 0.3 mLs (1.8 mg total) into the skin daily. 6 mL 1  . nitroGLYCERIN (NITROSTAT) 0.4 MG SL tablet Place 1 tablet (0.4 mg total) under the tongue every 5 (five) minutes as needed for chest pain. 25 tablet 12  . sildenafil (VIAGRA) 50 MG tablet Take 1 tablet (50 mg total) by mouth daily as needed for erectile dysfunction. 10 tablet 0  . spironolactone (ALDACTONE) 25 MG tablet Take 25 mg by mouth daily.    . warfarin (COUMADIN) 10 MG tablet Take 10 mg by mouth daily at 6 PM.      No current facility-administered medications on file prior to visit.       Objective:   Physical Exam  Constitutional: He is oriented to person, place, and time. He appears well-developed and well-nourished. No distress.  BP 102/68 mmHg  Pulse 89  Temp(Src) 98.4 F (36.9 C) (Oral)  Ht 6' 3" (1.905 m)  Wt 475 lb (215.459 kg)  BMI 59.37 kg/m2  SpO2 97%   HENT:  Head: Normocephalic and atraumatic.  Right Ear: External ear  normal.  Left Ear: External ear normal.  Cardiovascular: Normal rate, regular rhythm, normal heart sounds and intact distal pulses.  Exam reveals no gallop and no friction rub.   No murmur heard. Pulmonary/Chest: Effort normal and breath sounds normal. No respiratory distress. He has no wheezes. He has no rales. He exhibits no tenderness.  Musculoskeletal: He exhibits tenderness. He exhibits no edema.       Arms: Neurological: He is alert and oriented   to person, place, and time.  Skin: Skin is warm and dry. No rash noted. He is not diaphoretic.  Psychiatric: He has a normal mood and affect. His behavior is normal. Judgment and thought content normal.      Assessment & Plan:

## 2014-08-08 NOTE — Assessment & Plan Note (Signed)
Left shoulder pain worsening. After injury. Will try ultracet twice daily max (Cr cl is close to 30). Referred to Dr. Martha ClanKrasinski for further evaluation. Pt reports he can not get a regular MRI due to size. Will defer to Dr. Martha ClanKrasinski for further imaging.

## 2014-08-08 NOTE — Progress Notes (Signed)
Pre visit review using our clinic review tool, if applicable. No additional management support is needed unless otherwise documented below in the visit note. 

## 2014-08-27 ENCOUNTER — Ambulatory Visit: Payer: Medicare Other | Admitting: Nurse Practitioner

## 2014-08-27 DIAGNOSIS — Z0289 Encounter for other administrative examinations: Secondary | ICD-10-CM

## 2014-09-05 ENCOUNTER — Other Ambulatory Visit (INDEPENDENT_AMBULATORY_CARE_PROVIDER_SITE_OTHER): Payer: Medicare Other | Admitting: *Deleted

## 2014-09-05 ENCOUNTER — Ambulatory Visit (INDEPENDENT_AMBULATORY_CARE_PROVIDER_SITE_OTHER): Payer: Medicare Other | Admitting: Cardiology

## 2014-09-05 ENCOUNTER — Encounter: Payer: Self-pay | Admitting: Cardiology

## 2014-09-05 VITALS — BP 110/70 | HR 104 | Ht 74.0 in | Wt >= 6400 oz

## 2014-09-05 DIAGNOSIS — I482 Chronic atrial fibrillation, unspecified: Secondary | ICD-10-CM

## 2014-09-05 DIAGNOSIS — Z9861 Coronary angioplasty status: Secondary | ICD-10-CM

## 2014-09-05 DIAGNOSIS — I8312 Varicose veins of left lower extremity with inflammation: Secondary | ICD-10-CM

## 2014-09-05 DIAGNOSIS — I10 Essential (primary) hypertension: Secondary | ICD-10-CM

## 2014-09-05 DIAGNOSIS — R600 Localized edema: Secondary | ICD-10-CM

## 2014-09-05 DIAGNOSIS — Z794 Long term (current) use of insulin: Secondary | ICD-10-CM

## 2014-09-05 DIAGNOSIS — I2510 Atherosclerotic heart disease of native coronary artery without angina pectoris: Secondary | ICD-10-CM | POA: Diagnosis not present

## 2014-09-05 DIAGNOSIS — E785 Hyperlipidemia, unspecified: Secondary | ICD-10-CM

## 2014-09-05 DIAGNOSIS — E119 Type 2 diabetes mellitus without complications: Secondary | ICD-10-CM | POA: Diagnosis not present

## 2014-09-05 DIAGNOSIS — I872 Venous insufficiency (chronic) (peripheral): Secondary | ICD-10-CM

## 2014-09-05 DIAGNOSIS — I8311 Varicose veins of right lower extremity with inflammation: Secondary | ICD-10-CM

## 2014-09-05 DIAGNOSIS — N529 Male erectile dysfunction, unspecified: Secondary | ICD-10-CM

## 2014-09-05 DIAGNOSIS — N528 Other male erectile dysfunction: Secondary | ICD-10-CM

## 2014-09-05 NOTE — Patient Instructions (Signed)
Medication Instructions:  Your physician recommends that you continue on your current medications as directed. Please refer to the Current Medication list given to you today.   Labwork: none  Testing/Procedures: none  Follow-Up: Your physician wants you to follow-up in: six months with Dr. Harding.  You will receive a reminder letter in the mail two months in advance. If you don't receive a letter, please call our office to schedule the follow-up appointment.   Any Other Special Instructions Will Be Listed Below (If Applicable).   

## 2014-09-05 NOTE — Progress Notes (Signed)
PCP: Rubbie Battiest, NP  Clinic Note: Chief Complaint  Patient presents with  . other    No complaints. Meds reviewed verballly with pt.   HPI: Chad Baker is a 49 y.o. male with a PMH below who presents today for  follow-up status post PCI inferior STEMI. He was transferred to [facility] emergently from [facility] ER because Cath Lab Table weight limit back on January 19.  He is a super morbidly obese gentleman with poorly controlled type 2 diabetes, OSA, hypertension and hyperlipidemia, stage IV CKD and chronic atrial fibrillation. He was transferred [facility] for cardiac [facility]ization due to his extreme body weight.  He was brought urgently to the cardiac catheterization lab was found to have an occluded RCA. This was treated with thrombectomy and 2 very large bare-metal stents as indicated below. He had mildly reduced EF by echocardiogram but was discharged essentially an fast-track mode.  This is now his third visit since his MI. I last saw him in March, and he is doing quite well at that time. We had him stop aspirin in order to avoid triple medications for his stents. I gave him prescription for Viagra he, that has not yet filled due to cost.  He was supposed to start cardiac rehabilitation, but on the day that he was supposed to go for his orientation he was sick. He was supposed to call back for rescheduling the was never re-contacted. He has however started exercising and walking several days a week. He has also been working out at the gym up until the time he fell and hurt his shoulder.  He has clearly lost some weight since her last visit and just had lipids checked today which showed notable improvement since his peri-infarct numbers. Everything has improved.  Past Medical History  Diagnosis Date  . Chronic a-fib     a. coumadin.  . Stroke     a. 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  Marland Kitchen Essential hypertension   . Hyperlipidemia with target LDL  less than 70   . ST elevation myocardial infarction (STEMI) of inferior wall 04/2014    Transferred from [facility] due to patient's weight > [facility] Cath Table Limit (also with poor glycemic control  . CAD S/P percutaneous coronary angioplasty 04/2014    a. Inf STEMI CATH: LM nl, LAD 20, D1 nl, LCX 20p, RI nl, RCA 100% pRCA (5.63m x213m - 6.29m109meriflex BMS), 80% dRCA (4.5mm60m0mm45m5.1 mm [name] BMS);  b. 04/2014 Echo: EF 45-50%. Basal-mid inferior akinesis with basal inferoseptal akinesis. Moderate LA dilation. RV systolic function moderately reduced with hypokinetic free wall.  . Super obesity   . OSA (obstructive sleep apnea)   . Poorly controlled type 2 diabetes mellitus with circulatory disorder   . CKD (chronic kidney disease), stage IV   . Bilateral lower extremity edema - severe 04/24/2014  . Family history of premature CAD 04/24/2014    Prior Cardiac Evaluation and Past Surgical History: Past Surgical History  Procedure Laterality Date  . Left heart catheterization with coronary angiogram N/A 04/24/2014    Procedure: LEFT HEART CATHETERIZATION WITH CORONARY ANGIOGRAM;  Surgeon: DavidGlenetta Hew [facility]; Very Large RCA -> pRCA 100%, dRCA 80%  . Percutaneous coronary stent intervention (pci-s)  04/24/2014    Aspiration Thrombectomy --> pRCA VeriFlex BMS 5.0 x 28 (6.1 mm), dRCA (@ crux) [name] BMS 4.5 x 20 (5.1  mm)  . Transthoracic echocardiogram  January 2016    EF 45-50%. Basal and mid inferior akinesis and basal inferoseptal akinesis. Moderate LA dilation, RV systolic function moderately reduced with hypokinetic free wall.   Interval History: Today he presents without any major complications or complaints. He denies any resting or exertional chest tightness or pressure that was consistent with his anginal type pain. He still notes his baseline exertional dyspnea that he relates to his obesity and deconditioning. Despite having significant lower study edema, he denies any PND or  orthopnea. He continues to exercise routinely and has lost weight as noted above.he steadily has improved exercise tolerance and less fatigue.  He has chronic atrial fibrillation, but denies any sensation of irregular heartbeat unless he is overexerting himself and notes that his rate gets up too fast.  He denies any syncope or near syncopal type symptoms besides on couple occasions feeling somewhat orthostatic. He has not had any TIA or amaurosis fugax symptoms. No bleeding complications such as melena, hematochezia, hematuria or epistaxis. He does have some mild bruising issues.  ROS: A comprehensive was performed. Review of Systems  Constitutional: Negative for malaise/fatigue.  HENT: Negative for nosebleeds.   Respiratory: Positive for shortness of breath (Baseline exertional dyspnea from obesity.).   Cardiovascular: Positive for leg swelling (chronic lower edema with venous stasis dermatitis). Negative for claudication.  Gastrointestinal: Negative for heartburn, abdominal pain, blood in stool and melena.  Genitourinary: Negative for hematuria.  Musculoskeletal: Positive for back pain, joint pain and falls (Had a fall while playing again and her shoulder. This has gotten out of his routine habit of gymnasium exercise.). Negative for myalgias.  Endo/Heme/Allergies: Does not bruise/bleed easily.  Psychiatric/Behavioral: Negative for depression.  All other systems reviewed and are negative.   Current Outpatient Prescriptions on File Prior to Visit  Medication Sig Dispense Refill  . Chromium-Cinnamon (CINNAMON PLUS CHROMIUM PO) Take 500 mg by mouth 2 (two) times daily. Contains Chromium 100 mcg Two tablets twice a day    . Cranberry-Vitamin C-Vitamin E 4200-20-3 MG-MG-UNIT CAPS Take 2 capsules by mouth 2 (two) times daily.     Marland Kitchen glucose blood test strip 1 each by Other route as needed for other. Use as instructed with Freestyle monitor    . glucose monitoring kit (FREESTYLE) monitoring kit  1 each by Does not apply route as needed for other. 1 each 0  . insulin lispro (HUMALOG) 100 UNIT/ML injection Inject 0.1 mLs (10 Units total) into the skin 3 (three) times daily with meals. 10 mL 11  . Liraglutide 18 MG/3ML SOPN Inject 0.3 mLs (1.8 mg total) into the skin daily. 6 mL 1  . nitroGLYCERIN (NITROSTAT) 0.4 MG SL tablet Place 1 tablet (0.4 mg total) under the tongue every 5 (five) minutes as needed for chest pain. 25 tablet 12  . sildenafil (VIAGRA) 50 MG tablet Take 1 tablet (50 mg total) by mouth daily as needed for erectile dysfunction. 10 tablet 0  . warfarin (COUMADIN) 10 MG tablet Take 10 mg by mouth daily at 6 PM.      No current facility-administered medications on file prior to visit.   Allergies  Allergen Reactions  . Codeine Nausea And Vomiting  . Lantus [Insulin Glargine] Itching and Rash    History  Substance Use Topics  . Smoking status: Never Smoker   . Smokeless tobacco: Not on file  . Alcohol Use: No  He and his wife recently moved from [place] to the [address] area. He had  not yet established care provider for primary care prior to his MI. He is disabled following his stroke with hemorrhagic conversion several years ago.  Family History  Problem Relation Age of Onset  . Heart disease Father   . Heart disease Brother    PHYSICAL EXAM Wt Readings from Last 3 Encounters:  09/05/14 209.789 kg (462 lb 8 oz)  08/08/14 215.459 kg (475 lb)  06/19/14 212.193 kg (467 lb 12.8 oz)   BP 110/70 mmHg  Pulse 104  Ht _0  (1.88 m)  Wt 209.789 kg (462 lb 8 oz)  BMI 59.36 kg/m2 General appearance: alert, cooperative, appears stated age, no distress -- super morbidly obese Neck: no adenopathy, no carotid bruit and JVD NOT DETECTABLE DUE TO BODY HABITUS  Lungs:  CTA B normal percussion bilaterally and non-labored Heart:  Irregular/irregular rhythm with normal rate (Borderline tachycardic); distant heart sounds but normal S1 and S2;, no murmur, click, rub or  gallop; unable to palpate PMI Abdomen: soft, non-tender; bowel sounds normal; no masses,  no organomegaly;  unable to palpate HSM Extremities: extremities normal, atraumatic, no cyanosis, and  bilateral 2-3+ LEE with diffuse venous stasis discoloration - edema actually appears to be improved. Pulses: 2+ and symmetric;  radial pulses, but pedal pulses difficult to palpate due to edema. Skin: notable venous stasis changes in the lower extremities but otherwise no significant rashes or lesions.  Neurologic: Mental status: Alert, oriented, thought content appropriate; Cranial nerves: normal (II-XII grossly intact)   Adult ECG Report  Rate: 96 ;  Rhythm: atrial fibrillation and Controlled ventricular response. There is poor R-wave progression in anterior leads likely related to body habitus but cannot exclude anterior infarct, age undetermined. Generalized low voltage.  Otherwise nonspecific T-wave abnormalities and generalized low voltage (could suggest pulmonary disease or obesity.   Narrative Interpretation:  Relatively stable EKG  Recent Labs:   Lab Results  Component Value Date   CHOL 85* 09/05/2014   HDL 27* 09/05/2014   LDLCALC 36 09/05/2014   TRIG 111 09/05/2014   CHOLHDL 3.1 09/05/2014    ASSESSMENT / PLAN: Problem List Items Addressed This Visit    Bilateral lower extremity edema - severe (Chronic)    Relatively stable. Again recommended the use of Ace wraps since compression stockings don't work. The do not fit. Continue furosemide and spironolactone. Otherwise elevate legs.  Not associated with left heart failure symptoms of PND or orthopnea. This made partially be related to venous stasis in conjunction to elevated right-sided pressures from obesity/OHS.      CAD S/P 2 SITE BMS PCI TO PROX & DISTAL RCA -  (Chronic)    Very large BMS stents in the proximal and distal RCA. With ACS/STEMI presentation, will continue Plavix, but without aspirin since he is on warfarin. On  high-dose beta blocker, statin and hydralazine. Not on ACE inhibitors/ARBdue to his renal insufficiency. He is on spironolactone for additional diuresis with furosemide.       Chronic atrial fibrillation - Primary (Chronic)    Pretty well rate controlled with carvedilol. Anticoagulated with warfarin. Asymptomatic. I would like to see better rate control, I think some of this is compensatory       Relevant Orders   EKG 12-Lead (Completed)   Chronic venous stasis dermatitis of both lower extremities (Chronic)   Diabetes mellitus, type II, insulin dependent (Chronic)    Now being followed by PCP      Erectile dysfunction of organic origin (Chronic)    He had difficulty  filling prescription for Viagra. We will attempt to obtain some Benadryl from [place] where he can get lower dose Sildenafil.       Essential hypertension (Chronic)    Well-controlled.      Hyperlipidemia with target LDL less than 70    ggreat results from recent lipid panel. We can recheck in about 6 months. Continue atorvastatin at current dose for now.      Morbid obesity with body mass index of 50 or higher (Chronic)    Resume diet adjustments. He initially was losing weight and then gained it back. He is now back on his losing pattern. The patient understands the need to lose weight with diet and exercise. We have discussed specific strategies for this.  I will resubmit his referral to cardiac rehabilitation. This will ensure that he gets good dietary education as well as exercise recommendations.         Refer to cardiac rehab. They will call you with appt date and time.     Orders Placed This Encounter  Procedures  . EKG 12-Lead    Order Specific Question:  Where should this test be performed    Answer:  [facility]   Meds ordered this encounter  Medications  . CRANBERRY PO    Sig: Take by mouth 2 (two) times daily.     Followup: ~5-6 months   Jhoanna Heyde, Leonie Green,  M.D., M.S. Interventional Cardiologist   Pager # [phone]

## 2014-09-06 ENCOUNTER — Other Ambulatory Visit: Payer: Self-pay | Admitting: *Deleted

## 2014-09-06 ENCOUNTER — Encounter: Payer: Self-pay | Admitting: Cardiology

## 2014-09-06 LAB — LIPID PANEL
Chol/HDL Ratio: 3.1 ratio units (ref 0.0–5.0)
Cholesterol, Total: 85 mg/dL — ABNORMAL LOW (ref 100–199)
HDL: 27 mg/dL — ABNORMAL LOW (ref >39)
LDL Calculated: 36 mg/dL (ref 0–99)
Triglycerides: 111 mg/dL (ref 0–149)
VLDL Cholesterol Cal: 22 mg/dL (ref 5–40)

## 2014-09-06 MED ORDER — EZETIMIBE 10 MG PO TABS: 10.0000 mg | ORAL_TABLET | Freq: Every day | ORAL | Status: DC

## 2014-09-06 MED ORDER — FUROSEMIDE 80 MG PO TABS
80.0000 mg | ORAL_TABLET | Freq: Every day | ORAL | Status: DC
Start: 2014-09-06 — End: 2015-06-28

## 2014-09-06 MED ORDER — ALLOPURINOL 300 MG PO TABS: 300.0000 mg | ORAL_TABLET | Freq: Every day | ORAL | Status: DC

## 2014-09-06 MED ORDER — CLOPIDOGREL BISULFATE 75 MG PO TABS: 75.0000 mg | ORAL_TABLET | Freq: Every day | ORAL | Status: DC

## 2014-09-06 MED ORDER — ATORVASTATIN CALCIUM 80 MG PO TABS: 80.0000 mg | ORAL_TABLET | Freq: Every day | ORAL | Status: DC

## 2014-09-06 MED ORDER — SPIRONOLACTONE 25 MG PO TABS: 25.0000 mg | ORAL_TABLET | Freq: Every day | ORAL | Status: DC

## 2014-09-06 MED ORDER — HYDRALAZINE HCL 100 MG PO TABS: 100.0000 mg | ORAL_TABLET | Freq: Two times a day (BID) | ORAL | Status: DC

## 2014-09-06 MED ORDER — CITALOPRAM HYDROBROMIDE 40 MG PO TABS: 40.0000 mg | ORAL_TABLET | Freq: Every day | ORAL | Status: DC

## 2014-09-06 MED ORDER — GLIMEPIRIDE 4 MG PO TABS: 4.0000 mg | ORAL_TABLET | Freq: Two times a day (BID) | ORAL | Status: DC

## 2014-09-06 MED ORDER — CARVEDILOL 25 MG PO TABS: 25.0000 mg | ORAL_TABLET | Freq: Two times a day (BID) | ORAL | Status: DC

## 2014-09-06 NOTE — Assessment & Plan Note (Signed)
Now being followed by PCP

## 2014-09-06 NOTE — Assessment & Plan Note (Signed)
He had difficulty filling prescription for Viagra. We will attempt to obtain some Benadryl from [facility] - online pharmacy where he can get lower dose Sildenafil.

## 2014-09-06 NOTE — Assessment & Plan Note (Signed)
Resume diet adjustments. He initially was losing weight and then gained it back. He is now back on his losing pattern. The patient understands the need to lose weight with diet and exercise. We have discussed specific strategies for this.  I will resubmit his referral to cardiac rehabilitation. This will ensure that he gets good dietary education as well as exercise recommendations.

## 2014-09-06 NOTE — Assessment & Plan Note (Addendum)
Relatively stable. Again recommended the use of Ace wraps since compression stockings don't work. The do not fit. Continue furosemide and spironolactone. Otherwise elevate legs.  Not associated with left heart failure symptoms of PND or orthopnea. This made partially be related to venous stasis in conjunction to elevated right-sided pressures from obesity/OHS.

## 2014-09-06 NOTE — Assessment & Plan Note (Signed)
Very large BMS stents in the proximal and distal RCA. With ACS/STEMI presentation, will continue Plavix, but without aspirin since he is on warfarin. On high-dose beta blocker, statin and hydralazine. Not on ACE inhibitors/ARBdue to his renal insufficiency. He is on spironolactone for additional diuresis with furosemide.

## 2014-09-06 NOTE — Assessment & Plan Note (Signed)
Well controlled 

## 2014-09-06 NOTE — Assessment & Plan Note (Signed)
Pretty well rate controlled with carvedilol. Anticoagulated with warfarin. Asymptomatic. I would like to see better rate control, I think some of this is compensatory

## 2014-09-06 NOTE — Assessment & Plan Note (Signed)
ggreat results from recent lipid panel. We can recheck in about 6 months. Continue atorvastatin at current dose for now.

## 2014-09-06 NOTE — Addendum Note (Signed)
Addended by: Chandra BatchNIXON, Maikel Neisler E on: 09/06/2014 04:14 PM   Modules accepted: Orders

## 2014-10-04 ENCOUNTER — Other Ambulatory Visit: Payer: Self-pay | Admitting: Cardiology

## 2014-10-04 NOTE — Telephone Encounter (Signed)
REFILL 

## 2014-10-16 ENCOUNTER — Telehealth: Payer: Self-pay | Admitting: *Deleted

## 2014-10-16 NOTE — Telephone Encounter (Signed)
Patient notified of lab results and recommendations on increasing exercise and med change. He voiced understanding. Med list updated.

## 2014-10-16 NOTE — Telephone Encounter (Signed)
-----   Message from Marykay Lexavid W Harding, MD sent at 10/16/2014  7:41 AM EDT ----- Significant improvement in overall cholesterol levels. The only level that remains not at goal is the "good cholesterol" -- HDL.  The main way to increase this is by increased exercise. With this notable improvement in overall cholesterol, I think we can reduce the atorvastatin to 40 mg from 80mg  --> i.e. cut the tablet in half.  DH HARDING, Piedad ClimesAVID W, MD

## 2014-12-04 ENCOUNTER — Other Ambulatory Visit: Payer: Self-pay | Admitting: Cardiology

## 2014-12-04 ENCOUNTER — Other Ambulatory Visit: Payer: Self-pay | Admitting: Nurse Practitioner

## 2014-12-04 NOTE — Telephone Encounter (Signed)
Please advise on refill, it is from a historical provider.

## 2014-12-05 ENCOUNTER — Other Ambulatory Visit: Payer: Self-pay | Admitting: Nurse Practitioner

## 2014-12-05 NOTE — Telephone Encounter (Signed)
Rx(s) sent to pharmacy electronically.  

## 2014-12-27 ENCOUNTER — Other Ambulatory Visit: Payer: Self-pay | Admitting: Nurse Practitioner

## 2015-02-06 ENCOUNTER — Other Ambulatory Visit: Payer: Self-pay | Admitting: Nurse Practitioner

## 2015-02-06 NOTE — Telephone Encounter (Signed)
Patient was seen for an acute visit on 08/08/14, last routine visit was 06/19/14.  Please advise refill?

## 2015-02-08 ENCOUNTER — Ambulatory Visit (INDEPENDENT_AMBULATORY_CARE_PROVIDER_SITE_OTHER): Payer: Medicare Other | Admitting: Nurse Practitioner

## 2015-02-08 ENCOUNTER — Encounter: Payer: Self-pay | Admitting: Nurse Practitioner

## 2015-02-08 VITALS — BP 116/80 | HR 77 | Temp 98.1°F | Wt >= 6400 oz

## 2015-02-08 DIAGNOSIS — Z794 Long term (current) use of insulin: Secondary | ICD-10-CM

## 2015-02-08 DIAGNOSIS — E119 Type 2 diabetes mellitus without complications: Secondary | ICD-10-CM | POA: Diagnosis not present

## 2015-02-08 DIAGNOSIS — Z79899 Other long term (current) drug therapy: Secondary | ICD-10-CM | POA: Insufficient documentation

## 2015-02-08 LAB — HEMOGLOBIN A1C: Hgb A1c MFr Bld: 8.4 % — ABNORMAL HIGH (ref 4.6–6.5)

## 2015-02-08 LAB — MICROALBUMIN / CREATININE URINE RATIO
Creatinine,U: 47.2 mg/dL
Microalb Creat Ratio: 139.7 mg/g — ABNORMAL HIGH (ref 0.0–30.0)
Microalb, Ur: 65.9 mg/dL — ABNORMAL HIGH (ref 0.0–1.9)

## 2015-02-08 MED ORDER — SPIRONOLACTONE 25 MG PO TABS: 25.0000 mg | ORAL_TABLET | Freq: Every day | ORAL | Status: DC

## 2015-02-08 MED ORDER — HYDRALAZINE HCL 100 MG PO TABS: 100.0000 mg | ORAL_TABLET | Freq: Two times a day (BID) | ORAL | Status: DC

## 2015-02-08 MED ORDER — EZETIMIBE 10 MG PO TABS: 10.0000 mg | ORAL_TABLET | Freq: Every day | ORAL | Status: DC

## 2015-02-08 MED ORDER — ALLOPURINOL 300 MG PO TABS: 300.0000 mg | ORAL_TABLET | Freq: Every day | ORAL | Status: DC

## 2015-02-08 NOTE — Assessment & Plan Note (Addendum)
Patient had discussion with Dr. Herbie BaltimoreHarding at last visit. He has continually gained weight, approximately 20 pounds, in 5 months. He has a cardiac history and diabetes type II with insulin use, he was counseled on low carb healthy options, he has the handout given to him at a previous visit. He reports positive changes since returning mid-oct from [place]- visiting his mother. FU in 1 month

## 2015-02-08 NOTE — Assessment & Plan Note (Signed)
DM type II- insulin dependent. Foot exam today. Discussed healthy diet and exercise. Will obtain Urine Microalbumin and A1c today. FU in 1 month.

## 2015-02-08 NOTE — Patient Instructions (Signed)
Thanks for getting your flu shot today!   Your medications requested were refilled for 6 months.   Follow up in 1 month for checking on weight loss. Keep up the good work.

## 2015-02-08 NOTE — Progress Notes (Signed)
Patient ID: Chad Baker, male    DOB: 09/17/1965  Age: 49 y.o. MRN: [id number]  CC: Follow-up and Medication Refill   HPI Chad Baker presents for medication follow up and diabetes follow up.   - Flu shot given today!   1) Allopurinol-stable Hydralazine-stable Spironolactone- stable Zetia- stable Refill for 6 months  2) Diabetes-  Patient reports going to [place] to visit mother who is cooking a lot of fried foods. Blood sugars have been up since October when he returned back home. He reports getting back on track with diet and exercise. Blood sugars- 163 this morning  Increased to 15 units  No lows he reports Foot exam- patient has no visible wounds, feet are callused and toenails show fungal changes. He declines podiatry referral today.    3) Weight loss-  Patient has gained 20 pounds in the past 5 months Eating better since Oct. He reports   Gym- 3 x a week for 45 minutes (15 min weights and 30 min walking)   History Chad Baker has a past medical history of Chronic a-fib ([place]); Stroke [facility]); Essential hypertension; Hyperlipidemia with target LDL less than 70; ST elevation myocardial infarction (STEMI) of inferior wall (HCC) (04/2014); CAD S/P percutaneous coronary angioplasty (04/2014); Super obesity ([place]); OSA (obstructive sleep apnea); Poorly controlled type 2 diabetes mellitus with circulatory disorder ([place]); CKD (chronic kidney disease), stage IV ([place]); Bilateral lower extremity edema - severe (04/24/2014); and Family history of premature CAD (04/24/2014).   He has past surgical history that includes left heart catheterization with coronary angiogram (N/A, 04/24/2014); Percutaneous coronary stent intervention (pci-s) (04/24/2014); and transthoracic echocardiogram (January 2016).   His family history includes Heart disease in his brother and father.He reports that he has never smoked. He does not have any smokeless tobacco history on file. He reports that he does not drink alcohol or use  illicit drugs.  Outpatient Prescriptions Prior to Visit  Medication Sig Dispense Refill  . atorvastatin (LIPITOR) 80 MG tablet Take 40 mg by mouth daily.    Marland Kitchen atorvastatin (LIPITOR) 80 MG tablet TAKE 1 TABLET(80 MG) BY MOUTH DAILY 90 tablet 2  . carvedilol (COREG) 25 MG tablet Take 1 tablet (25 mg total) by mouth 2 (two) times daily with a meal. 180 tablet 3  . Chromium-Cinnamon (CINNAMON PLUS CHROMIUM PO) Take 500 mg by mouth 2 (two) times daily. Contains Chromium 100 mcg Two tablets twice a day    . citalopram (CELEXA) 40 MG tablet Take 1 tablet (40 mg total) by mouth daily. 90 tablet 0  . citalopram (CELEXA) 40 MG tablet TAKE 1 TABLET(40 MG) BY MOUTH DAILY 90 tablet 2  . clopidogrel (PLAVIX) 75 MG tablet TAKE 1 TABLET BY MOUTH DAILY WITH BREAKFAST 90 tablet 2  . CRANBERRY PO Take by mouth 2 (two) times daily.    . Cranberry-Vitamin C-Vitamin E 4200-20-3 MG-MG-UNIT CAPS Take 2 capsules by mouth 2 (two) times daily.     . furosemide (LASIX) 80 MG tablet Take 1 tablet (80 mg total) by mouth daily. 90 tablet 1  . glimepiride (AMARYL) 4 MG tablet Take 1 tablet (4 mg total) by mouth 2 (two) times daily. 180 tablet 1  . glimepiride (AMARYL) 4 MG tablet TAKE 1 TABLET(4 MG) BY MOUTH TWICE DAILY 60 tablet 0  . glucose blood test strip 1 each by Other route as needed for other. Use as instructed with Freestyle monitor    . glucose monitoring kit (FREESTYLE) monitoring kit 1 each by Does not apply route  as needed for other. 1 each 0  . insulin lispro (HUMALOG) 100 UNIT/ML injection Inject 0.1 mLs (10 Units total) into the skin 3 (three) times daily with meals. 10 mL 11  . Liraglutide 18 MG/3ML SOPN Inject 0.3 mLs (1.8 mg total) into the skin daily. 6 mL 1  . nitroGLYCERIN (NITROSTAT) 0.4 MG SL tablet Place 1 tablet (0.4 mg total) under the tongue every 5 (five) minutes as needed for chest pain. 25 tablet 12  . sildenafil (VIAGRA) 50 MG tablet Take 1 tablet (50 mg total) by mouth daily as needed for  erectile dysfunction. 10 tablet 0  . warfarin (COUMADIN) 10 MG tablet Take 10 mg by mouth daily at 6 PM.     . allopurinol (ZYLOPRIM) 300 MG tablet Take 1 tablet (300 mg total) by mouth daily. 90 tablet 1  . ezetimibe (ZETIA) 10 MG tablet Take 1 tablet (10 mg total) by mouth daily. 90 tablet 1  . hydrALAZINE (APRESOLINE) 100 MG tablet Take 1 tablet (100 mg total) by mouth 2 (two) times daily. 180 tablet 1  . spironolactone (ALDACTONE) 25 MG tablet Take 1 tablet (25 mg total) by mouth daily. 90 tablet 1   No facility-administered medications prior to visit.    ROS Review of Systems  Constitutional: Negative for fever, chills, diaphoresis, activity change, appetite change, fatigue and unexpected weight change.  Eyes: Negative for visual disturbance.  Respiratory: Negative for chest tightness, shortness of breath and wheezing.   Cardiovascular: Negative for chest pain, palpitations and leg swelling.  Gastrointestinal: Negative for nausea, vomiting and diarrhea.  Endocrine: Negative for polydipsia, polyphagia and polyuria.  Skin: Negative for rash.  Neurological: Negative for dizziness, weakness and numbness.  Psychiatric/Behavioral: The patient is not nervous/anxious.     Objective:  BP 116/80 mmHg  Pulse 77  Temp(Src) 98.1 F (36.7 C) (Oral)  Wt 482 lb (218.634 kg)  SpO2 97%  Physical Exam  Constitutional: He is oriented to person, place, and time. He appears well-developed and well-nourished. No distress.  HENT:  Head: Normocephalic and atraumatic.  Right Ear: External ear normal.  Left Ear: External ear normal.  Cardiovascular: Normal rate, regular rhythm, normal heart sounds and intact distal pulses.  Exam reveals no gallop and no friction rub.   No murmur heard. Pulmonary/Chest: Effort normal and breath sounds normal. No respiratory distress. He has no wheezes. He has no rales. He exhibits no tenderness.  Abdominal:  Obese  Neurological: He is alert and oriented to person,  place, and time.  Monofilament exam normal  Skin: Skin is warm and dry. No rash noted. He is not diaphoretic.  Psychiatric: He has a normal mood and affect. His behavior is normal. Judgment and thought content normal.      Assessment & Plan:   Chad Baker was seen today for follow-up and medication refill.  Diagnoses and all orders for this visit:  Diabetes mellitus, type II, insulin dependent ([place]) -     Hemoglobin A1c -     Urine Microalbumin w/creat. ratio  Medication management  Morbid obesity with body mass index of 50 or higher (HCC)  Other orders -     spironolactone (ALDACTONE) 25 MG tablet; Take 1 tablet (25 mg total) by mouth daily. -     hydrALAZINE (APRESOLINE) 100 MG tablet; Take 1 tablet (100 mg total) by mouth 2 (two) times daily. -     ezetimibe (ZETIA) 10 MG tablet; Take 1 tablet (10 mg total) by mouth daily. -  allopurinol (ZYLOPRIM) 300 MG tablet; Take 1 tablet (300 mg total) by mouth daily.   I am having Mr. Hove maintain his Chromium-Cinnamon (CINNAMON PLUS CHROMIUM PO), Cranberry-Vitamin C-Vitamin E, warfarin, glucose monitoring kit, Liraglutide, insulin lispro, nitroGLYCERIN, sildenafil, glucose blood, CRANBERRY PO, carvedilol, citalopram, furosemide, glimepiride, atorvastatin, clopidogrel, glimepiride, citalopram, atorvastatin, spironolactone, hydrALAZINE, ezetimibe, allopurinol, and acarbose.  Meds ordered this encounter  Medications  . spironolactone (ALDACTONE) 25 MG tablet    Sig: Take 1 tablet (25 mg total) by mouth daily.    Dispense:  90 tablet    Refill:  1    Order Specific Question:  Supervising Provider    Answer:  Deborra Medina L [[id number]]  . hydrALAZINE (APRESOLINE) 100 MG tablet    Sig: Take 1 tablet (100 mg total) by mouth 2 (two) times daily.    Dispense:  180 tablet    Refill:  1    Order Specific Question:  Supervising Provider    Answer:  Deborra Medina L [[id number]]  . ezetimibe (ZETIA) 10 MG tablet    Sig: Take 1 tablet (10 mg total) by  mouth daily.    Dispense:  90 tablet    Refill:  1    Order Specific Question:  Supervising Provider    Answer:  Deborra Medina L [[id number]]  . allopurinol (ZYLOPRIM) 300 MG tablet    Sig: Take 1 tablet (300 mg total) by mouth daily.    Dispense:  90 tablet    Refill:  1    Order Specific Question:  Supervising Provider    Answer:  Deborra Medina L [[id number]]  . acarbose (PRECOSE) 50 MG tablet    Sig: Take 1 tablet by mouth 3 (three) times daily before meals.    Refill:  2     Follow-up: Return in about 4 weeks (around 03/08/2015) for Weight loss .

## 2015-02-08 NOTE — Assessment & Plan Note (Signed)
Refills of 4 medications as requested by patient. Stable on these and to continue these per Cardiology and myself.

## 2015-02-10 ENCOUNTER — Other Ambulatory Visit: Payer: Self-pay | Admitting: Nurse Practitioner

## 2015-02-10 DIAGNOSIS — Z794 Long term (current) use of insulin: Principal | ICD-10-CM

## 2015-02-10 DIAGNOSIS — E118 Type 2 diabetes mellitus with unspecified complications: Principal | ICD-10-CM

## 2015-02-10 DIAGNOSIS — E1165 Type 2 diabetes mellitus with hyperglycemia: Secondary | ICD-10-CM

## 2015-02-10 DIAGNOSIS — IMO0002 Reserved for concepts with insufficient information to code with codable children: Secondary | ICD-10-CM

## 2015-02-27 ENCOUNTER — Ambulatory Visit (INDEPENDENT_AMBULATORY_CARE_PROVIDER_SITE_OTHER): Payer: Medicare Other | Admitting: Nurse Practitioner

## 2015-02-27 VITALS — BP 126/92 | HR 93 | Temp 97.9°F | Resp 18 | Ht 74.0 in | Wt >= 6400 oz

## 2015-02-27 DIAGNOSIS — J069 Acute upper respiratory infection, unspecified: Secondary | ICD-10-CM

## 2015-02-27 NOTE — Progress Notes (Signed)
Patient ID: Chad Baker, male    DOB: 02-22-1966  Age: 49 y.o. MRN: [id number]  CC: Sore Throat   HPI Chad Baker presents for CC of sore throat and URI symptoms x 1 day.   1) Pt reports Tuesday started. Sore/scratchy throat at first.  Post-nasal drip, sneezing, and nasal congestion. Throat losenzges- not helpful Denies sick contacts.   History Ansel has a past medical history of Chronic a-fib ([place]); Stroke [facility]); Essential hypertension; Hyperlipidemia with target LDL less than 70; ST elevation myocardial infarction (STEMI) of inferior wall (HCC) (04/2014); CAD S/P percutaneous coronary angioplasty (04/2014); Super obesity ([place]); OSA (obstructive sleep apnea); Poorly controlled type 2 diabetes mellitus with circulatory disorder ([place]); CKD (chronic kidney disease), stage IV ([place]); Bilateral lower extremity edema - severe (04/24/2014); and Family history of premature CAD (04/24/2014).   He has past surgical history that includes left heart catheterization with coronary angiogram (N/A, 04/24/2014); Percutaneous coronary stent intervention (pci-s) (04/24/2014); and transthoracic echocardiogram (January 2016).   His family history includes Heart disease in his brother and father.He reports that he has never smoked. He does not have any smokeless tobacco history on file. He reports that he does not drink alcohol or use illicit drugs.  Outpatient Prescriptions Prior to Visit  Medication Sig Dispense Refill  . acarbose (PRECOSE) 50 MG tablet Take 1 tablet by mouth 3 (three) times daily before meals.  2  . allopurinol (ZYLOPRIM) 300 MG tablet Take 1 tablet (300 mg total) by mouth daily. 90 tablet 1  . atorvastatin (LIPITOR) 80 MG tablet Take 40 mg by mouth daily.    Marland Kitchen atorvastatin (LIPITOR) 80 MG tablet TAKE 1 TABLET(80 MG) BY MOUTH DAILY 90 tablet 2  . carvedilol (COREG) 25 MG tablet Take 1 tablet (25 mg total) by mouth 2 (two) times daily with a meal. 180 tablet 3  . Chromium-Cinnamon (CINNAMON PLUS CHROMIUM PO)  Take 500 mg by mouth 2 (two) times daily. Contains Chromium 100 mcg Two tablets twice a day    . citalopram (CELEXA) 40 MG tablet Take 1 tablet (40 mg total) by mouth daily. 90 tablet 0  . citalopram (CELEXA) 40 MG tablet TAKE 1 TABLET(40 MG) BY MOUTH DAILY 90 tablet 2  . clopidogrel (PLAVIX) 75 MG tablet TAKE 1 TABLET BY MOUTH DAILY WITH BREAKFAST 90 tablet 2  . CRANBERRY PO Take by mouth 2 (two) times daily.    . Cranberry-Vitamin C-Vitamin E 4200-20-3 MG-MG-UNIT CAPS Take 2 capsules by mouth 2 (two) times daily.     Marland Kitchen ezetimibe (ZETIA) 10 MG tablet Take 1 tablet (10 mg total) by mouth daily. 90 tablet 1  . furosemide (LASIX) 80 MG tablet Take 1 tablet (80 mg total) by mouth daily. 90 tablet 1  . glimepiride (AMARYL) 4 MG tablet Take 1 tablet (4 mg total) by mouth 2 (two) times daily. 180 tablet 1  . glimepiride (AMARYL) 4 MG tablet TAKE 1 TABLET(4 MG) BY MOUTH TWICE DAILY 60 tablet 0  . glucose blood test strip 1 each by Other route as needed for other. Use as instructed with Freestyle monitor    . glucose monitoring kit (FREESTYLE) monitoring kit 1 each by Does not apply route as needed for other. 1 each 0  . hydrALAZINE (APRESOLINE) 100 MG tablet Take 1 tablet (100 mg total) by mouth 2 (two) times daily. 180 tablet 1  . insulin lispro (HUMALOG) 100 UNIT/ML injection Inject 0.1 mLs (10 Units total) into the skin 3 (three) times daily with meals. 10  mL 11  . Liraglutide 18 MG/3ML SOPN Inject 0.3 mLs (1.8 mg total) into the skin daily. 6 mL 1  . nitroGLYCERIN (NITROSTAT) 0.4 MG SL tablet Place 1 tablet (0.4 mg total) under the tongue every 5 (five) minutes as needed for chest pain. 25 tablet 12  . sildenafil (VIAGRA) 50 MG tablet Take 1 tablet (50 mg total) by mouth daily as needed for erectile dysfunction. 10 tablet 0  . spironolactone (ALDACTONE) 25 MG tablet Take 1 tablet (25 mg total) by mouth daily. 90 tablet 1  . warfarin (COUMADIN) 10 MG tablet Take 10 mg by mouth daily at 6 PM.      No  facility-administered medications prior to visit.    ROS Review of Systems  Constitutional: Negative for fever, chills, diaphoresis and fatigue.  HENT: Positive for congestion, postnasal drip, rhinorrhea, sinus pressure, sneezing and sore throat. Negative for ear discharge, ear pain, tinnitus and trouble swallowing.   Eyes: Negative for visual disturbance.  Respiratory: Negative for chest tightness, shortness of breath and wheezing.   Cardiovascular: Negative for chest pain, palpitations and leg swelling.  Gastrointestinal: Negative for nausea, vomiting and diarrhea.  Skin: Negative for rash.  Neurological: Negative for dizziness, weakness and numbness.  Psychiatric/Behavioral: The patient is not nervous/anxious.     Objective:  BP 126/92 mmHg  Pulse 93  Temp(Src) 97.9 F (36.6 C)  Resp 18  Ht 6' 2" (1.88 m)  Wt 463 lb 12.8 oz (210.378 kg)  BMI 59.52 kg/m2  SpO2 95%  Physical Exam  Constitutional: He is oriented to person, place, and time. He appears well-developed and well-nourished. No distress.  HENT:  Head: Normocephalic and atraumatic.  Right Ear: External ear normal.  Left Ear: External ear normal.  Cardiovascular: Normal rate, regular rhythm, normal heart sounds and intact distal pulses.  Exam reveals no gallop and no friction rub.   No murmur heard. Pulmonary/Chest: Effort normal and breath sounds normal. No respiratory distress. He has no wheezes. He has no rales. He exhibits no tenderness.  Neurological: He is alert and oriented to person, place, and time.  Skin: Skin is warm and dry. No rash noted. He is not diaphoretic.  Psychiatric: He has a normal mood and affect. His behavior is normal. Judgment and thought content normal.   Assessment & Plan:   Lakshya was seen today for sore throat.  Diagnoses and all orders for this visit:  Acute URI   I am having Mr. Deutschman maintain his Chromium-Cinnamon (CINNAMON PLUS CHROMIUM PO), Cranberry-Vitamin C-Vitamin E,  warfarin, glucose monitoring kit, Liraglutide, insulin lispro, nitroGLYCERIN, sildenafil, glucose blood, CRANBERRY PO, carvedilol, citalopram, furosemide, glimepiride, atorvastatin, clopidogrel, glimepiride, citalopram, atorvastatin, spironolactone, hydrALAZINE, ezetimibe, allopurinol, and acarbose.  No orders of the defined types were placed in this encounter.     Follow-up: Return if symptoms worsen or fail to improve.

## 2015-02-27 NOTE — Patient Instructions (Addendum)
Mucinex plain over the counter to help with mucous production, saline nasal spray to help with nasal congestion, vitamin C and lots of water!  Upper Respiratory Infection, Adult Most upper respiratory infections (URIs) are a viral infection of the air passages leading to the lungs. A URI affects the nose, throat, and upper air passages. The most common type of URI is nasopharyngitis and is typically referred to as "the common cold." URIs run their course and usually go away on their own. Most of the time, a URI does not require medical attention, but sometimes a bacterial infection in the upper airways can follow a viral infection. This is called a secondary infection. Sinus and middle ear infections are common types of secondary upper respiratory infections. Bacterial pneumonia can also complicate a URI. A URI can worsen asthma and chronic obstructive pulmonary disease (COPD). Sometimes, these complications can require emergency medical care and may be life threatening.  CAUSES Almost all URIs are caused by viruses. A virus is a type of germ and can spread from one person to another.  RISKS FACTORS You may be at risk for a URI if:   You smoke.   You have chronic heart or lung disease.  You have a weakened defense (immune) system.   You are very young or very old.   You have nasal allergies or asthma.  You work in crowded or poorly ventilated areas.  You work in health care facilities or schools. SIGNS AND SYMPTOMS  Symptoms typically develop 2-3 days after you come in contact with a cold virus. Most viral URIs last 7-10 days. However, viral URIs from the influenza virus (flu virus) can last 14-18 days and are typically more severe. Symptoms may include:   Runny or stuffy (congested) nose.   Sneezing.   Cough.   Sore throat.   Headache.   Fatigue.   Fever.   Loss of appetite.   Pain in your forehead, behind your eyes, and over your cheekbones (sinus  pain).  Muscle aches.  DIAGNOSIS  Your health care provider may diagnose a URI by:  Physical exam.  Tests to check that your symptoms are not due to another condition such as:  Strep throat.  Sinusitis.  Pneumonia.  Asthma. TREATMENT  A URI goes away on its own with time. It cannot be cured with medicines, but medicines may be prescribed or recommended to relieve symptoms. Medicines may help:  Reduce your fever.  Reduce your cough.  Relieve nasal congestion. HOME CARE INSTRUCTIONS   Take medicines only as directed by your health care provider.   Gargle warm saltwater or take cough drops to comfort your throat as directed by your health care provider.  Use a warm mist humidifier or inhale steam from a shower to increase air moisture. This may make it easier to breathe.  Drink enough fluid to keep your urine clear or pale yellow.   Eat soups and other clear broths and maintain good nutrition.   Rest as needed.   Return to work when your temperature has returned to normal or as your health care provider advises. You may need to stay home longer to avoid infecting others. You can also use a face mask and careful hand washing to prevent spread of the virus.  Increase the usage of your inhaler if you have asthma.   Do not use any tobacco products, including cigarettes, chewing tobacco, or electronic cigarettes. If you need help quitting, ask your health care provider. PREVENTION  The  best way to protect yourself from getting a cold is to practice good hygiene.   Avoid oral or hand contact with people with cold symptoms.   Wash your hands often if contact occurs.  There is no clear evidence that vitamin C, vitamin E, echinacea, or exercise reduces the chance of developing a cold. However, it is always recommended to get plenty of rest, exercise, and practice good nutrition.  SEEK MEDICAL CARE IF:   You are getting worse rather than better.   Your symptoms are  not controlled by medicine.   You have chills.  You have worsening shortness of breath.  You have brown or red mucus.  You have yellow or brown nasal discharge.  You have pain in your face, especially when you bend forward.  You have a fever.  You have swollen neck glands.  You have pain while swallowing.  You have white areas in the back of your throat. SEEK IMMEDIATE MEDICAL CARE IF:   You have severe or persistent:  Headache.  Ear pain.  Sinus pain.  Chest pain.  You have chronic lung disease and any of the following:  Wheezing.  Prolonged cough.  Coughing up blood.  A change in your usual mucus.  You have a stiff neck.  You have changes in your:  Vision.  Hearing.  Thinking.  Mood. MAKE SURE YOU:   Understand these instructions.  Will watch your condition.  Will get help right away if you are not doing well or get worse.   This information is not intended to replace advice given to you by your health care provider. Make sure you discuss any questions you have with your health care provider.   Document Released: 09/16/2000 Document Revised: 08/07/2014 Document Reviewed: 06/28/2013 Elsevier Interactive Patient Education [employer].

## 2015-03-06 ENCOUNTER — Ambulatory Visit: Payer: Medicare Other | Admitting: Nurse Practitioner

## 2015-03-06 ENCOUNTER — Telehealth: Payer: Self-pay

## 2015-03-06 ENCOUNTER — Other Ambulatory Visit: Payer: Self-pay | Admitting: Nurse Practitioner

## 2015-03-06 ENCOUNTER — Telehealth: Payer: Self-pay | Admitting: Nurse Practitioner

## 2015-03-06 ENCOUNTER — Telehealth: Payer: Self-pay | Admitting: *Deleted

## 2015-03-06 ENCOUNTER — Ambulatory Visit (INDEPENDENT_AMBULATORY_CARE_PROVIDER_SITE_OTHER): Payer: Medicare Other | Admitting: *Deleted

## 2015-03-06 DIAGNOSIS — I482 Chronic atrial fibrillation, unspecified: Secondary | ICD-10-CM

## 2015-03-06 DIAGNOSIS — Z7901 Long term (current) use of anticoagulants: Secondary | ICD-10-CM

## 2015-03-06 LAB — POCT INR: INR: 2.5

## 2015-03-06 MED ORDER — LEVOFLOXACIN 500 MG PO TABS: 500.0000 mg | ORAL_TABLET | Freq: Every day | ORAL | Status: DC

## 2015-03-06 MED ORDER — AMOXICILLIN-POT CLAVULANATE 500-125 MG PO TABS: 1.0000 | ORAL_TABLET | Freq: Two times a day (BID) | ORAL | Status: DC

## 2015-03-06 NOTE — Telephone Encounter (Signed)
Pt calling stating he went to PCP and they prescribed new medication and needs to make sure he can take it It is called Levofloxacin 500 mg 7 pills 1 a day Please advise.

## 2015-03-06 NOTE — Telephone Encounter (Signed)
It was not listed on his allergies which we ask about every time!!!! Please add to allergies and I will send in something else.

## 2015-03-06 NOTE — Telephone Encounter (Signed)
done

## 2015-03-06 NOTE — Telephone Encounter (Signed)
Pharmacy called and stated patient is allergic to penicillin. Amoxicillin was called in. Please advise?

## 2015-03-06 NOTE — Telephone Encounter (Signed)
Did you received this message?

## 2015-03-06 NOTE — Telephone Encounter (Signed)
Spoke with Pharmacist- please let [name] know the medication is okay and sent in to his pharmacy.

## 2015-03-06 NOTE — Telephone Encounter (Signed)
LMOM for patient & advised that Levaquin & Coumadin interacts with Coumadin, therefore he will need to has his INR rechecked sooner.

## 2015-03-06 NOTE — Telephone Encounter (Signed)
Caller name: [facility] [place] Relationship to patient: Pharmancy Can be reached: [phone]  Reason for call: Rx for Levaquin that was electronically sent in interacting with Coumadin and Celexa.

## 2015-03-08 NOTE — Telephone Encounter (Signed)
Med called in

## 2015-03-08 NOTE — Telephone Encounter (Signed)
Lmom that we have been trying to reach him since Wednesday. LM that Levaquin does interact with Coumadin and we need to check INR today, call us back so we can have him put on [place] schedule for INR.

## 2015-03-10 ENCOUNTER — Encounter: Payer: Self-pay | Admitting: Nurse Practitioner

## 2015-03-10 DIAGNOSIS — J069 Acute upper respiratory infection, unspecified: Secondary | ICD-10-CM | POA: Insufficient documentation

## 2015-03-10 NOTE — Assessment & Plan Note (Signed)
New onset. Only 1.5 days of symptoms at this point. Pt is on coumadin so we want to avoid abx if possible. Start Mucinex plain, saline nasal spray, and drink lots of water. Advised good handwashing and continue healthy diet to help immune system. Call back if still having symptoms or worsening symptoms in 7 days and we can try an antibiotic. Will follow.

## 2015-03-11 NOTE — Telephone Encounter (Signed)
Telephoned pt and finally did reach him, he states he did get our message but has been busy. He finishes Levaquin today he states, he can't come in today for INR check. States that he can come in tomorrow. Explain that Levaquin usually raises INR and would really like a check today, but he states the earliest he can go into [place] office is tomorrow, thus appt s/c for 9am. Pt denies any bleeding or bruising issues.

## 2015-03-26 ENCOUNTER — Telehealth: Payer: Self-pay | Admitting: Cardiology

## 2015-03-26 NOTE — Telephone Encounter (Signed)
3 attempts to schedule from recall list. lmov to call office .   Deleting recall.   °

## 2015-05-10 ENCOUNTER — Telehealth: Payer: Self-pay

## 2015-05-10 NOTE — Telephone Encounter (Signed)
Started PA for Zetia on cover my meds.

## 2015-05-13 NOTE — Telephone Encounter (Signed)
Ezetimibe was approved as a medication on the plan's list of covered drugs.

## 2015-05-15 ENCOUNTER — Ambulatory Visit (INDEPENDENT_AMBULATORY_CARE_PROVIDER_SITE_OTHER): Payer: Medicare Other | Admitting: Cardiology

## 2015-05-15 ENCOUNTER — Encounter: Payer: Self-pay | Admitting: Cardiology

## 2015-05-15 VITALS — BP 130/74 | HR 70 | Ht 76.0 in | Wt >= 6400 oz

## 2015-05-15 DIAGNOSIS — I10 Essential (primary) hypertension: Secondary | ICD-10-CM

## 2015-05-15 DIAGNOSIS — E785 Hyperlipidemia, unspecified: Secondary | ICD-10-CM

## 2015-05-15 DIAGNOSIS — I2119 ST elevation (STEMI) myocardial infarction involving other coronary artery of inferior wall: Secondary | ICD-10-CM | POA: Diagnosis not present

## 2015-05-15 DIAGNOSIS — Z9861 Coronary angioplasty status: Secondary | ICD-10-CM

## 2015-05-15 DIAGNOSIS — I872 Venous insufficiency (chronic) (peripheral): Secondary | ICD-10-CM

## 2015-05-15 DIAGNOSIS — I482 Chronic atrial fibrillation, unspecified: Secondary | ICD-10-CM

## 2015-05-15 DIAGNOSIS — R600 Localized edema: Secondary | ICD-10-CM

## 2015-05-15 DIAGNOSIS — I2510 Atherosclerotic heart disease of native coronary artery without angina pectoris: Secondary | ICD-10-CM

## 2015-05-15 DIAGNOSIS — I8311 Varicose veins of right lower extremity with inflammation: Secondary | ICD-10-CM

## 2015-05-15 DIAGNOSIS — I8312 Varicose veins of left lower extremity with inflammation: Secondary | ICD-10-CM

## 2015-05-15 NOTE — Patient Instructions (Signed)
Medication Instructions:  Your physician recommends that you continue on your current medications as directed. Please refer to the Current Medication list given to you today.   Labwork: CMET, fasting lipid and liver profile in one month. Nothing to eat or drink after midnight the evening before your labs.  Testing/Procedures: none  Follow-Up: Your physician wants you to follow-up in: six months with Dr. Herbie Baltimore.  You will receive a reminder letter in the mail two months in advance. If you don't receive a letter, please call our office to schedule the follow-up appointment.   Any Other Special Instructions Will Be Listed Below (If Applicable).     If you need a refill on your cardiac medications before your next appointment, please call your pharmacy.

## 2015-05-15 NOTE — Assessment & Plan Note (Signed)
Is now just over a year out from his MI. He is not having any recurrent anginal or heart failure symptoms. Unfortunately he has not really picked up his activity level. Hopefully, now that he is planning to be married in May, he desires to have weight loss. Therefore we talked about increasing exercise and watch his diet. Essentially since he had low normal to mildly reduced EF on echo, I plan to recheck an echocardiogram simply to show improved function. He's not having signs of heart failure.

## 2015-05-15 NOTE — Assessment & Plan Note (Signed)
Last lipid panel was from June. That look pretty good. I think we can recheck lipid panel now with chemistries. Continue current dose of statin.

## 2015-05-15 NOTE — Progress Notes (Signed)
PCP: Rubbie Battiest, NP  Clinic Note: Chief Complaint  Patient presents with  . other    6 month follow up. Meds reviewed by the patient verbally.   . Coronary Artery Disease  . Obesity  . Atrial Fibrillation    HPI: Chad Baker is a super morbidly obese 50 y.o. male with a PMH below who presents today for ~6-8 month f/u of CAD-PCI for Inferior STEMI.  Also has CAF on warfarin. OSA,HTN, HLD, ED; chronic LE edema w/ venous stasis. Jan 2016 --> Inf. STEMI - transferred from [facility] to [facility] (due to body weight) -- 100% RCA - thrombectomy, PCI with 2 very large BMS.  Mild Ischemic CM.  Chad Baker was last seen on September 05, 2014 -- stopped ASA.  He never did do cardiac rehabilitation. He pretty much has not done any rehabilitation or exercise - no weight change from last visit.  Had lost some wgt from MI, but gained it back.  Recent Hospitalizations: N/A  Studies Reviewed: None  Interval History:  [name] presents today without any major cardiac complaints. He denies any sensation of atrial fibrillation. No rapid irregular heartbeats. No bleeding issues while on warfarin and Plavix. No PND, orthopnea to go along with his chronic lower extremity edema. He says the swelling is pretty good when he wakes up in the morning, but is just a relatively significant by the end of the day. He denies any resting or exertional anginal chest pain. He really doesn't do much exertion, therefore he really has no exertional dyspnea. Much of this could be simply due to deconditioning and obesity. He wears his CPAP at night, but otherwise denies any PND or orthopnea.  No palpitations, lightheadedness, dizziness, weakness or syncope/near syncope. No TIA/amaurosis fugax symptoms. No melena, hematochezia, hematuria, or epstaxis. No claudication.  ROS: A comprehensive was performed. Review of Systems  Constitutional: Negative for weight loss and malaise/fatigue.  HENT: Negative for nosebleeds.   Eyes: Negative for  blurred vision.  Respiratory: Negative for cough, shortness of breath and wheezing.   Cardiovascular: Positive for leg swelling.       Per history of present illness  Gastrointestinal: Negative for heartburn, abdominal pain, blood in stool and melena.  Genitourinary: Negative for dysuria and hematuria.  Musculoskeletal: Positive for joint pain (Knees and hips as well as back).  Neurological: Negative.  Negative for weakness and headaches.  Endo/Heme/Allergies: Bruises/bleeds easily.  Psychiatric/Behavioral: Negative.   All other systems reviewed and are negative.   Past Medical History  Diagnosis Date  . Chronic a-fib (HCC)     a. coumadin.  . Stroke [facility])     a. 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  Marland Kitchen Essential hypertension   . Hyperlipidemia with target LDL less than 70   . ST elevation myocardial infarction (STEMI) of inferior wall ([place]) 04/2014    Transferred from [facility] due to patient's weight > [facility] Cath Table Limit (also with poor glycemic control  . CAD S/P percutaneous coronary angioplasty 04/2014    a. Inf STEMI CATH: LM nl, LAD 20, D1 nl, LCX 20p, RI nl, RCA 100% pRCA (5.69m x212m - 6.33m24meriflex BMS), 80% dRCA (4.5mm5m0mm60m5.1 mm [name] BMS);  b. 04/2014 Echo: EF 45-50%. Basal-mid inferior akinesis with basal inferoseptal akinesis. Moderate LA dilation. RV systolic function moderately reduced with hypokinetic free wall.  . Super obesity (HCC) [place] OSA (obstructive sleep apnea)   . Poorly controlled  type 2 diabetes mellitus with circulatory disorder ([place])   . CKD (chronic kidney disease), stage IV ([place])   . Bilateral lower extremity edema - severe 04/24/2014  . Family history of premature CAD 04/24/2014    Past Surgical History  Procedure Laterality Date  . Left heart catheterization with coronary angiogram N/A 04/24/2014    Procedure: LEFT HEART CATHETERIZATION WITH CORONARY ANGIOGRAM;  Surgeon: Glenetta Hew, MD, Location: [facility] CATH [facility]; Very Large RCA ->  pRCA 100%, dRCA 80%  . Percutaneous coronary stent intervention (pci-s)  04/24/2014    Aspiration Thrombectomy --> pRCA VeriFlex BMS 5.0 x 28 (6.1 mm), dRCA (@ crux) [name] BMS 4.5 x 20 (5.1 mm)  . Transthoracic echocardiogram  January 2016    EF 45-50%. Basal and mid inferior akinesis and basal inferoseptal akinesis. Moderate LA dilation, RV systolic function moderately reduced with hypokinetic free wall.   Prior to Admission medications   Medication Sig Start Date End Date Taking? Authorizing Provider  allopurinol (ZYLOPRIM) 300 MG tablet Take 1 tablet (300 mg total) by mouth daily. 02/08/15  Yes Rubbie Battiest, NP  atorvastatin (LIPITOR) 80 MG tablet Take 40 mg by mouth daily.   Yes Historical Provider, MD  carvedilol (COREG) 25 MG tablet Take 1 tablet (25 mg total) by mouth 2 (two) times daily with a meal. 09/06/14  Yes Leonie Man, MD  citalopram (CELEXA) 40 MG tablet Take 1 tablet (40 mg total) by mouth daily. 09/06/14  Yes Rubbie Battiest, NP  clopidogrel (PLAVIX) 75 MG tablet TAKE 1 TABLET BY MOUTH DAILY WITH BREAKFAST 12/05/14  Yes Leonie Man, MD  Cranberry-Vitamin C-Vitamin E 4200-20-3 MG-MG-UNIT CAPS Take 2 capsules by mouth 2 (two) times daily.    Yes Historical Provider, MD  ezetimibe (ZETIA) 10 MG tablet Take 1 tablet (10 mg total) by mouth daily. 02/08/15  Yes Rubbie Battiest, NP  furosemide (LASIX) 80 MG tablet Take 1 tablet (80 mg total) by mouth daily. 09/06/14  Yes Rubbie Battiest, NP  glimepiride (AMARYL) 4 MG tablet TAKE 1 TABLET(4 MG) BY MOUTH TWICE DAILY 12/05/14  Yes Rubbie Battiest, NP  glucose blood test strip 1 each by Other route as needed for other. Use as instructed with Freestyle monitor   Yes Historical Provider, MD  glucose monitoring kit (FREESTYLE) monitoring kit 1 each by Does not apply route as needed for other. 04/26/14  Yes Geradine Girt, DO  hydrALAZINE (APRESOLINE) 100 MG tablet Take 1 tablet (100 mg total) by mouth 2 (two) times daily. 02/08/15  Yes Rubbie Battiest, NP    insulin lispro (HUMALOG) 100 UNIT/ML injection Inject 0.1 mLs (10 Units total) into the skin 3 (three) times daily with meals. 05/22/14  Yes Rubbie Battiest, NP  Liraglutide 18 MG/3ML SOPN Inject 0.3 mLs (1.8 mg total) into the skin daily. 04/26/14  Yes Geradine Girt, DO  nitroGLYCERIN (NITROSTAT) 0.4 MG SL tablet Place 1 tablet (0.4 mg total) under the tongue every 5 (five) minutes as needed for chest pain. 05/29/14  Yes Leonie Man, MD  spironolactone (ALDACTONE) 25 MG tablet Take 1 tablet (25 mg total) by mouth daily. 02/08/15  Yes Rubbie Battiest, NP  warfarin (COUMADIN) 10 MG tablet Take 10 mg by mouth daily at 6 PM.    Yes Historical Provider, MD  Chromium-Cinnamon (CINNAMON PLUS CHROMIUM PO) Take 500 mg by mouth 2 (two) times daily. Reported on 05/15/2015    Historical Provider, MD    Allergies  Allergen Reactions  . Codeine Nausea And Vomiting  . Penicillins   . Lantus [Insulin Glargine] Itching and Rash    Social History   Social History  . Marital Status: Single    Spouse Name: N/A  . Number of Children: N/A  . Years of Education: N/A   Social History Main Topics  . Smoking status: Never Smoker   . Smokeless tobacco: None  . Alcohol Use: No  . Drug Use: No  . Sexual Activity:    Partners: Female     Comment: 1 partner    Other Topics Concern  . None   Social History Narrative   Originally from [place], moved to [place]. Has girlfriend in [employer] [place].      Family History  Problem Relation Age of Onset  . Heart disease Father   . Heart disease Brother      Wt Readings from Last 3 Encounters:  05/15/15 464 lb 8 oz (210.696 kg)  02/27/15 463 lb 12.8 oz (210.378 kg)  02/08/15 482 lb (218.634 kg)    PHYSICAL EXAM BP 130/74 mmHg  Pulse 70  Ht 6' 4"  (1.93 m)  Wt 464 lb 8 oz (210.696 kg)  BMI 56.56 kg/m2 General appearance: alert, cooperative, appears stated age, no distress -- super morbidly obese Neck: no adenopathy, no carotid bruit and JVD NOT  DETECTABLE DUE TO BODY HABITUS  Lungs: CTA B normal percussion bilaterally and non-labored Heart: Irregular/irregular rhythm with normal rate (Borderline tachycardic); distant heart sounds but normal S1 and S2;, no murmur, click, rub or gallop; unable to palpate PMI Abdomen: soft, non-tender; bowel sounds normal; no masses, no organomegaly; unable to palpate HSM Extremities: extremities normal, atraumatic, no cyanosis, and bilateral 2-3+ LEE with diffuse venous stasis discoloration - edema actually appears to be improved. Pulses: 2+ and symmetric; radial pulses, but pedal pulses difficult to palpate due to edema. Skin: notable venous stasis changes in the lower extremities but otherwise no significant rashes or lesions.  Neurologic: Mental status: Alert, oriented, thought content appropriate; Cranial nerves: normal (II-XII grossly intact)    Adult ECG Report  Rate: 70 ;  Rhythm: atrial fibrillation and Controlled rate. Low voltage overall. Cannot exclude inferior MI, age undetermined. Also poor progression in the anterior leads.;   Narrative Interpretation: No significant change from prior EKG.   Other studies Reviewed: Additional studies/ records that were reviewed today include:  Recent Labs:  No recent labs  ASSESSMENT / PLAN: Problem List Items Addressed This Visit    ST elevation myocardial infarction (STEMI) of inferior wall, subsequent episode of care ([place]) - Primary (Chronic)    Is now just over a year out from his MI. He is not having any recurrent anginal or heart failure symptoms. Unfortunately he has not really picked up his activity level. Hopefully, now that he is planning to be married in May, he desires to have weight loss. Therefore we talked about increasing exercise and watch his diet. Essentially since he had low normal to mildly reduced EF on echo, I plan to recheck an echocardiogram simply to show improved function. He's not having signs of heart failure.         Relevant Orders   EKG 12-Lead (Completed)   Morbid obesity with body mass index of 50 or higher (HCC) (Chronic)    Unfortunately, he has not been able to lose weight. We had another long discussion about improved dietary discretion and exercise. His primary provider is Dr. about this to him as well. The  main thing he needs to do, is reduce his daily food intake slowly in order to tolerate smaller helpings. In conjunction with doing this, he needs to increase his exercise. He also needs to work on more healthy food options.  He hopes to lose 20-30 pounds from now to me because he is getting married in. Hopefully this will give him more motivation to do so.      Hyperlipidemia with target LDL less than 70 (Chronic)    Last lipid panel was from June. That look pretty good. I think we can recheck lipid panel now with chemistries. Continue current dose of statin.      Relevant Orders   EKG 12-Lead (Completed)   Lipid Profile   Hepatic function panel   Comp Met (CMET)   Essential hypertension (Chronic)    Pretty well-controlled on current regimen. For now will continue current dosing, I would like to switch from hydralazine to ACE inhibitor/ARB once we know his renal function.      Relevant Orders   EKG 12-Lead (Completed)   Chronic venous stasis dermatitis of both lower extremities (Chronic)   Chronic atrial fibrillation (HCC) (Chronic)    Rate is well controlled with carvedilol. Asymptomatic anticoagulated with warfarin.  This patients CHA2DS2-VASc Score and unadjusted Ischemic Stroke Rate (% per year) is equal to 7.2 % stroke rate/year from a score of 5  Above score calculated as 1 point each if present [CHF, HTN, DM, Vascular=MI/PAD/Aortic Plaque, Age if 65-74, or Male] Above score calculated as 2 points each if present [Age > 75, or Stroke/TIA/TE]       Relevant Orders   EKG 12-Lead (Completed)   Comp Met (CMET)   CAD S/P 2 SITE BMS PCI TO PROX & DISTAL RCA -  (Chronic)    2  very large bare-metal stents in the proximal and distal RCA. He is now beyond 1 year from his PCI. I think are okay stopping Plavix and returned aspirin low-dose. He is on full dose carvedilol as well as statin. He is on hydralazine and not ACE inhibitor/ARB due to renal insufficiency in the past. He is on spironolactone plus furosemide for his chronic edema. When we recheck his renal function, if his function is stable or improved, I would consider switching to ACE inhibitor or ARB from hydralazine for better afterload reduction in diabetic patient.      Relevant Orders   EKG 12-Lead (Completed)   Bilateral lower extremity edema - severe (Chronic)    This is from venous stasis. Like I did last time, I recommended using Ace wraps for compression because stockings don't work. I told him to increase his Lasix dosing for about a week when he first starts this. Then return to standing dose. Otherwise I expect him to elevate his legs as much as possible.  No other heart failure symptoms PND orthopnea, this is probably related to venous stasis as well as potential OHS with his obesity.      Relevant Orders   EKG 12-Lead (Completed)      Current medicines are reviewed at length with the patient today. (+/- concerns) none The following changes have been made: Okay to stop Plavix the end of current prescription and start aspirin 81 mg daily. Recommend using Ace wraps or lower extremity edema. Twice a day dosing of Lasix for several days then return to once daily.  Studies Ordered:   Orders Placed This Encounter  Procedures  . Lipid Profile  . Hepatic function panel  .  Comp Met (CMET)  . EKG 12-Lead      Leonie Man, M.D., M.S. Interventional Cardiologist   Pager # [phone] Phone # [phone] [address] [place], [place] [address]

## 2015-05-15 NOTE — Assessment & Plan Note (Signed)
Pretty well-controlled on current regimen. For now will continue current dosing, I would like to switch from hydralazine to ACE inhibitor/ARB once we know his renal function.

## 2015-05-15 NOTE — Assessment & Plan Note (Signed)
Rate is well controlled with carvedilol. Asymptomatic anticoagulated with warfarin.  This patients CHA2DS2-VASc Score and unadjusted Ischemic Stroke Rate (% per year) is equal to 7.2 % stroke rate/year from a score of 5  Above score calculated as 1 point each if present [CHF, HTN, DM, Vascular=MI/PAD/Aortic Plaque, Age if 65-74, or Male] Above score calculated as 2 points each if present [Age > 75, or Stroke/TIA/TE]

## 2015-05-15 NOTE — Assessment & Plan Note (Signed)
Unfortunately, he has not been able to lose weight. We had another long discussion about improved dietary discretion and exercise. His primary provider is Dr. about this to him as well. The main thing he needs to do, is reduce his daily food intake slowly in order to tolerate smaller helpings. In conjunction with doing this, he needs to increase his exercise. He also needs to work on more healthy food options.  He hopes to lose 20-30 pounds from now to me because he is getting married in. Hopefully this will give him more motivation to do so.

## 2015-05-15 NOTE — Assessment & Plan Note (Signed)
2 very large bare-metal stents in the proximal and distal RCA. He is now beyond 1 year from his PCI. I think are okay stopping Plavix and returned aspirin low-dose. He is on full dose carvedilol as well as statin. He is on hydralazine and not ACE inhibitor/ARB due to renal insufficiency in the past. He is on spironolactone plus furosemide for his chronic edema. When we recheck his renal function, if his function is stable or improved, I would consider switching to ACE inhibitor or ARB from hydralazine for better afterload reduction in diabetic patient.

## 2015-05-15 NOTE — Assessment & Plan Note (Signed)
This is from venous stasis. Like I did last time, I recommended using Ace wraps for compression because stockings don't work. I told him to increase his Lasix dosing for about a week when he first starts this. Then return to standing dose. Otherwise I expect him to elevate his legs as much as possible.  No other heart failure symptoms PND orthopnea, this is probably related to venous stasis as well as potential OHS with his obesity.

## 2015-05-22 ENCOUNTER — Other Ambulatory Visit: Payer: Medicare Other

## 2015-06-24 ENCOUNTER — Other Ambulatory Visit: Payer: Self-pay | Admitting: Cardiology

## 2015-06-24 ENCOUNTER — Other Ambulatory Visit (INDEPENDENT_AMBULATORY_CARE_PROVIDER_SITE_OTHER): Payer: Medicare Other

## 2015-06-24 DIAGNOSIS — I482 Chronic atrial fibrillation, unspecified: Secondary | ICD-10-CM

## 2015-06-24 DIAGNOSIS — E785 Hyperlipidemia, unspecified: Secondary | ICD-10-CM | POA: Diagnosis not present

## 2015-06-25 LAB — LIPID PANEL
Chol/HDL Ratio: 2.9 ratio units (ref 0.0–5.0)
Cholesterol, Total: 101 mg/dL (ref 100–199)
HDL: 35 mg/dL — ABNORMAL LOW (ref >39)
LDL Calculated: 46 mg/dL (ref 0–99)
Triglycerides: 102 mg/dL (ref 0–149)
VLDL Cholesterol Cal: 20 mg/dL (ref 5–40)

## 2015-06-25 LAB — COMPREHENSIVE METABOLIC PANEL
ALT: 40 IU/L (ref 0–44)
AST: 19 IU/L (ref 0–40)
Albumin/Globulin Ratio: 1.6 (ref 1.2–2.2)
Albumin: 4.1 g/dL (ref 3.5–5.5)
Alkaline Phosphatase: 202 IU/L — ABNORMAL HIGH (ref 39–117)
BUN/Creatinine Ratio: 22 — ABNORMAL HIGH (ref 9–20)
BUN: 44 mg/dL — ABNORMAL HIGH (ref 6–24)
Bilirubin Total: 0.6 mg/dL (ref 0.0–1.2)
CO2: 15 mmol/L — ABNORMAL LOW (ref 18–29)
Calcium: 8.5 mg/dL — ABNORMAL LOW (ref 8.7–10.2)
Chloride: 107 mmol/L — ABNORMAL HIGH (ref 96–106)
Creatinine, Ser: 2.02 mg/dL — ABNORMAL HIGH (ref 0.76–1.27)
GFR calc Af Amer: 44 mL/min/{1.73_m2} — ABNORMAL LOW (ref >59)
GFR calc non Af Amer: 38 mL/min/{1.73_m2} — ABNORMAL LOW (ref >59)
Globulin, Total: 2.5 g/dL (ref 1.5–4.5)
Glucose: 84 mg/dL (ref 65–99)
Potassium: 5.4 mmol/L — ABNORMAL HIGH (ref 3.5–5.2)
Sodium: 140 mmol/L (ref 134–144)
Total Protein: 6.6 g/dL (ref 6.0–8.5)

## 2015-06-28 ENCOUNTER — Other Ambulatory Visit: Payer: Self-pay | Admitting: Nurse Practitioner

## 2015-07-11 ENCOUNTER — Other Ambulatory Visit: Payer: Self-pay | Admitting: Cardiology

## 2015-07-11 NOTE — Telephone Encounter (Signed)
Rx request sent to pharmacy.  

## 2015-08-14 ENCOUNTER — Telehealth: Payer: Self-pay | Admitting: Pharmacist

## 2015-08-14 NOTE — Telephone Encounter (Signed)
LM to follow-up and try to schedule appointment for INR since patient is overdue.

## 2015-08-27 ENCOUNTER — Ambulatory Visit (INDEPENDENT_AMBULATORY_CARE_PROVIDER_SITE_OTHER): Payer: Medicare Other | Admitting: Family Medicine

## 2015-08-27 ENCOUNTER — Other Ambulatory Visit: Payer: Self-pay | Admitting: Family Medicine

## 2015-08-27 ENCOUNTER — Encounter: Payer: Self-pay | Admitting: Family Medicine

## 2015-08-27 VITALS — BP 110/72 | HR 70 | Temp 97.6°F | Ht 76.0 in | Wt >= 6400 oz

## 2015-08-27 DIAGNOSIS — E1122 Type 2 diabetes mellitus with diabetic chronic kidney disease: Secondary | ICD-10-CM | POA: Diagnosis not present

## 2015-08-27 DIAGNOSIS — Z794 Long term (current) use of insulin: Secondary | ICD-10-CM

## 2015-08-27 DIAGNOSIS — E119 Type 2 diabetes mellitus without complications: Secondary | ICD-10-CM

## 2015-08-27 DIAGNOSIS — R0989 Other specified symptoms and signs involving the circulatory and respiratory systems: Secondary | ICD-10-CM

## 2015-08-27 DIAGNOSIS — I482 Chronic atrial fibrillation, unspecified: Secondary | ICD-10-CM

## 2015-08-27 DIAGNOSIS — N184 Chronic kidney disease, stage 4 (severe): Secondary | ICD-10-CM

## 2015-08-27 LAB — COMPREHENSIVE METABOLIC PANEL
ALT: 31 U/L (ref 0–53)
AST: 18 U/L (ref 0–37)
Albumin: 4.1 g/dL (ref 3.5–5.2)
Alkaline Phosphatase: 174 U/L — ABNORMAL HIGH (ref 39–117)
BUN: 73 mg/dL — ABNORMAL HIGH (ref 6–23)
CO2: 20 mEq/L (ref 19–32)
Calcium: 9.6 mg/dL (ref 8.4–10.5)
Chloride: 108 mEq/L (ref 96–112)
Creatinine, Ser: 2.41 mg/dL — ABNORMAL HIGH (ref 0.40–1.50)
GFR: 30.6 mL/min — ABNORMAL LOW (ref >60.00)
Glucose, Bld: 247 mg/dL — ABNORMAL HIGH (ref 70–99)
Potassium: 5.2 mEq/L — ABNORMAL HIGH (ref 3.5–5.1)
Sodium: 136 mEq/L (ref 135–145)
Total Bilirubin: 0.7 mg/dL (ref 0.2–1.2)
Total Protein: 7.1 g/dL (ref 6.0–8.3)

## 2015-08-27 LAB — HEMOGLOBIN A1C: Hgb A1c MFr Bld: 8.8 % — ABNORMAL HIGH (ref 4.6–6.5)

## 2015-08-27 LAB — PROTIME-INR
INR: 1.4 ratio — ABNORMAL HIGH (ref 0.8–1.0)
Prothrombin Time: 15.2 s — ABNORMAL HIGH (ref 9.6–13.1)

## 2015-08-27 MED ORDER — WARFARIN SODIUM 10 MG PO TABS: 10.0000 mg | ORAL_TABLET | Freq: Every day | ORAL | Status: DC

## 2015-08-27 NOTE — Telephone Encounter (Signed)
Would you like to wait until INR is back to refill?

## 2015-08-27 NOTE — Assessment & Plan Note (Signed)
We will check kidney function today. We're going to refer to nephrology.

## 2015-08-27 NOTE — Progress Notes (Signed)
Patient ID: Chad Baker, male   DOB: [birth year], 50 y.o.   MRN: [id number]  Chad Rumps, MD Phone: [phone]  Chad Baker is a 50 y.o. male who presents today for follow-up.  DIABETES Disease Monitoring: Blood Sugar ranges-140s Polyuria/phagia/dipsia- no     ophthalmology- has not seen in the last year, is going to schedule an appointment Medications: Compliance- taking glimepiride, Humalog, and Victoza. 15 units of Humalog 3 times a day Hypoglycemic symptoms- no Does not see podiatry.  Atrial fibrillation: Patient's followed by cardiology for this. Notes he took his last Coumadin today. Takes 10 mg Sunday Monday Wednesday and Friday and 15 mg Tuesday Thursday Saturday. He denies palpitations, chest pain, and shortness of breath. Follow-up with cardiology is next month. Has not had an INR in a number of months.  CKD stage IV: Notes he has had chronic kidney disease since he had a stroke in 2000. Was previously seeing a nephrologist though has not since he moved to [place].  PMH: nonsmoker.   ROS see history of present illness  Objective  Physical Exam Filed Vitals:   08/27/15 1015  BP: 110/72  Pulse: 70  Temp: 97.6 F (36.4 C)    BP Readings from Last 3 Encounters:  08/27/15 110/72  05/15/15 130/74  02/27/15 126/92   Wt Readings from Last 3 Encounters:  08/27/15 459 lb 3.2 oz (208.292 kg)  05/15/15 464 lb 8 oz (210.696 kg)  02/27/15 463 lb 12.8 oz (210.378 kg)    Physical Exam  Constitutional: No distress.  Morbidly obese  HENT:  Head: Normocephalic and atraumatic.  Cardiovascular: Normal rate and normal heart sounds.  An irregularly irregular rhythm present.  2+ DP pulse on the left, unable to find DP pulse on the right  Pulmonary/Chest: Effort normal and breath sounds normal.  Musculoskeletal:  Bilateral lower extremity swelling with hemosiderin deposition changes  Neurological: He is alert. Gait normal.  Sensation to light touch intact bilaterally  extremities, intact to monofilament  Skin: Skin is warm and dry. He is not diaphoretic.  Callus formation on left middle toe tip, no other skin lesions or ulcerations noted     Assessment/Plan: Please see individual problem list.  Diabetes mellitus, type II, insulin dependent Sugars appear relatively well controlled. We'll check an A1c today. Advised to see an eye doctor. Given a prescription for diabetic shoes. Advised to continue to monitor his feet and if anything changes he will let us know. Given absent DP pulse on the right we will obtain ABIs.  Absent pedal pulses Absent DP pulse on the right side. Feet are warm and well perfused with good capillary refill. No signs of ischemia. We will obtain ABIs.  Chronic kidney disease, stage IV (severe) We will check kidney function today. We're going to refer to nephrology.  Chronic atrial fibrillation (HCC) Chronic A. fib. Asymptomatic. No recent INR check. Needs refill on Coumadin. We will check an INR today and provide a refill for his Coumadin. He was advised that his future refills of Coumadin need to be done through his cardiology office.    Orders Placed This Encounter  Procedures  . Comp Met (CMET)  . INR/PT  . HgB A1c  . Ambulatory referral to Nephrology    Referral Priority:  Routine    Referral Type:  Consultation    Referral Reason:  Specialty Services Required    Requested Specialty:  Nephrology    Number of Visits Requested:  1    Meds ordered this encounter  Medications  . warfarin (COUMADIN) 10 MG tablet    Sig: Take 1 tablet (10 mg total) by mouth daily at 6 PM.    Dispense:  30 tablet    Refill:  [place], MD [place]

## 2015-08-27 NOTE — Patient Instructions (Signed)
Nice to meet you. We will check some lab work today. We will refill your Coumadin. Future please send refill request to your cardiologist. We will get you referred to see a nephrologist. Please monitor your feet closely and if you develop any decreased sensation or ulcers or skin lesions please let us know. We will also get you scheduled to have ABIs completed

## 2015-08-27 NOTE — Assessment & Plan Note (Signed)
Chronic A. fib. Asymptomatic. No recent INR check. Needs refill on Coumadin. We will check an INR today and provide a refill for his Coumadin. He was advised that his future refills of Coumadin need to be done through his cardiology office.

## 2015-08-27 NOTE — Assessment & Plan Note (Signed)
Sugars appear relatively well controlled. We'll check an A1c today. Advised to see an eye doctor. Given a prescription for diabetic shoes. Advised to continue to monitor his feet and if anything changes he will let us know. Given absent DP pulse on the right we will obtain ABIs.

## 2015-08-27 NOTE — Assessment & Plan Note (Signed)
Absent DP pulse on the right side. Feet are warm and well perfused with good capillary refill. No signs of ischemia. We will obtain ABIs.

## 2015-08-27 NOTE — Telephone Encounter (Signed)
Refill was sent in during patient's office visit.

## 2015-08-27 NOTE — Progress Notes (Signed)
Pre visit review using our clinic review tool, if applicable. No additional management support is needed unless otherwise documented below in the visit note. 

## 2015-08-28 DIAGNOSIS — R0989 Other specified symptoms and signs involving the circulatory and respiratory systems: Secondary | ICD-10-CM | POA: Diagnosis not present

## 2015-09-05 ENCOUNTER — Other Ambulatory Visit (INDEPENDENT_AMBULATORY_CARE_PROVIDER_SITE_OTHER): Payer: Medicare Other

## 2015-09-05 ENCOUNTER — Telehealth: Payer: Self-pay | Admitting: Family Medicine

## 2015-09-05 ENCOUNTER — Other Ambulatory Visit: Payer: Self-pay | Admitting: Family Medicine

## 2015-09-05 ENCOUNTER — Other Ambulatory Visit: Payer: Medicare Other

## 2015-09-05 DIAGNOSIS — I482 Chronic atrial fibrillation, unspecified: Secondary | ICD-10-CM

## 2015-09-05 DIAGNOSIS — E875 Hyperkalemia: Secondary | ICD-10-CM

## 2015-09-05 LAB — COMPREHENSIVE METABOLIC PANEL
ALT: 25 U/L (ref 0–53)
AST: 16 U/L (ref 0–37)
Albumin: 4.1 g/dL (ref 3.5–5.2)
Alkaline Phosphatase: 161 U/L — ABNORMAL HIGH (ref 39–117)
BUN: 73 mg/dL — ABNORMAL HIGH (ref 6–23)
CO2: 19 mEq/L (ref 19–32)
Calcium: 9 mg/dL (ref 8.4–10.5)
Chloride: 109 mEq/L (ref 96–112)
Creatinine, Ser: 2.39 mg/dL — ABNORMAL HIGH (ref 0.40–1.50)
GFR: 30.89 mL/min — ABNORMAL LOW (ref >60.00)
Glucose, Bld: 190 mg/dL — ABNORMAL HIGH (ref 70–99)
Potassium: 5.7 mEq/L — ABNORMAL HIGH (ref 3.5–5.1)
Sodium: 135 mEq/L (ref 135–145)
Total Bilirubin: 0.5 mg/dL (ref 0.2–1.2)
Total Protein: 6.8 g/dL (ref 6.0–8.3)

## 2015-09-05 LAB — PROTIME-INR
INR: 2.3 ratio — ABNORMAL HIGH (ref 0.8–1.0)
Prothrombin Time: 24.5 s — ABNORMAL HIGH (ref 9.6–13.1)

## 2015-09-05 NOTE — Telephone Encounter (Signed)
Attempted to call patient. There is no answer. Left voicemail asking him to call back to the office.  When he calls back please let them know that his potassium is slightly high at 5.7. I would like to recheck this on Friday (09/06/15). his INR is acceptable.

## 2015-09-05 NOTE — Addendum Note (Signed)
Addended by: Felix AhmadiFRANSEN, Sukanya Goldblatt A on: 09/05/2015 02:19 PM   Modules accepted: Orders

## 2015-09-06 NOTE — Telephone Encounter (Signed)
Patient was offered lab appointment today. Did not want to come in today. We'll place order for next week.

## 2015-09-06 NOTE — Telephone Encounter (Signed)
Patient stated that he has been eating bananas daily. He had a peanut butter and banana sandwich before appt. He did not schedule a lab appt.

## 2015-09-06 NOTE — Telephone Encounter (Signed)
Scheduled patient to come in on 09/11/15

## 2015-09-06 NOTE — Telephone Encounter (Signed)
Please place order.

## 2015-09-06 NOTE — Telephone Encounter (Signed)
Informed patient to half dose of spironolactone.

## 2015-09-06 NOTE — Addendum Note (Signed)
Addended by: Birdie SonsSONNENBERG, Vernel Donlan G on: 09/06/2015 01:05 PM   Modules accepted: Orders

## 2015-09-06 NOTE — Telephone Encounter (Signed)
Order is been placed. Please have him half his dose of spironolactone as this could be contributing to his elevated potassium.

## 2015-09-11 ENCOUNTER — Other Ambulatory Visit (INDEPENDENT_AMBULATORY_CARE_PROVIDER_SITE_OTHER): Payer: Medicare Other

## 2015-09-11 DIAGNOSIS — E118 Type 2 diabetes mellitus with unspecified complications: Secondary | ICD-10-CM

## 2015-09-11 DIAGNOSIS — E1165 Type 2 diabetes mellitus with hyperglycemia: Secondary | ICD-10-CM

## 2015-09-11 DIAGNOSIS — Z794 Long term (current) use of insulin: Secondary | ICD-10-CM

## 2015-09-11 DIAGNOSIS — I482 Chronic atrial fibrillation, unspecified: Secondary | ICD-10-CM

## 2015-09-11 DIAGNOSIS — IMO0002 Reserved for concepts with insufficient information to code with codable children: Secondary | ICD-10-CM

## 2015-09-11 LAB — BASIC METABOLIC PANEL WITH GFR
BUN: 69 mg/dL — ABNORMAL HIGH (ref 6–23)
CO2: 18 meq/L — ABNORMAL LOW (ref 19–32)
Calcium: 9 mg/dL (ref 8.4–10.5)
Chloride: 108 meq/L (ref 96–112)
Creatinine, Ser: 2.19 mg/dL — ABNORMAL HIGH (ref 0.40–1.50)
GFR: 34.17 mL/min — ABNORMAL LOW
Glucose, Bld: 231 mg/dL — ABNORMAL HIGH (ref 70–99)
Potassium: 5 meq/L (ref 3.5–5.1)
Sodium: 134 meq/L — ABNORMAL LOW (ref 135–145)

## 2015-09-15 ENCOUNTER — Other Ambulatory Visit: Payer: Self-pay | Admitting: Nurse Practitioner

## 2015-09-19 ENCOUNTER — Encounter: Payer: Self-pay | Admitting: Family Medicine

## 2015-10-01 ENCOUNTER — Other Ambulatory Visit: Payer: Self-pay | Admitting: Surgical

## 2015-10-01 ENCOUNTER — Telehealth: Payer: Self-pay | Admitting: *Deleted

## 2015-10-01 ENCOUNTER — Other Ambulatory Visit: Payer: Self-pay

## 2015-10-01 ENCOUNTER — Telehealth: Payer: Self-pay

## 2015-10-01 DIAGNOSIS — R0989 Other specified symptoms and signs involving the circulatory and respiratory systems: Secondary | ICD-10-CM

## 2015-10-01 NOTE — Progress Notes (Signed)
CVD need to the orders that were placed changed.

## 2015-10-01 NOTE — Telephone Encounter (Signed)
[facility] has requested to have paperwork faxed back over to them with a signature   Fax [phone]

## 2015-10-01 NOTE — Telephone Encounter (Signed)
Had an opening for sooner appt on 10/07/15 .  Sp[facility]. She will have Dr. Adriana Simasook enter LE Arterial order and let patient know appt has been r/s.

## 2015-10-01 NOTE — Telephone Encounter (Signed)
Please advise if you have received the paperwork in question, thanks

## 2015-10-02 NOTE — Telephone Encounter (Signed)
Paperwork was faxed 10/01/15

## 2015-10-03 ENCOUNTER — Telehealth: Payer: Self-pay | Admitting: *Deleted

## 2015-10-03 ENCOUNTER — Other Ambulatory Visit: Payer: Self-pay | Admitting: Family Medicine

## 2015-10-03 DIAGNOSIS — R0989 Other specified symptoms and signs involving the circulatory and respiratory systems: Secondary | ICD-10-CM

## 2015-10-03 DIAGNOSIS — E119 Type 2 diabetes mellitus without complications: Secondary | ICD-10-CM | POA: Diagnosis not present

## 2015-10-03 NOTE — Telephone Encounter (Signed)
[facility] received the paperwork for the medical necessity,however it was not signed. [facility] requested notes in reference to the diabetic care and foot exam.   Fax [phone]

## 2015-10-03 NOTE — Telephone Encounter (Signed)
Did not get all the forms that needed to filled out. Called [facility] and they will send the other forms.

## 2015-10-03 NOTE — Telephone Encounter (Signed)
Please advise 

## 2015-10-07 ENCOUNTER — Other Ambulatory Visit: Payer: Self-pay | Admitting: Cardiology

## 2015-10-07 ENCOUNTER — Ambulatory Visit: Payer: Medicare Other

## 2015-10-07 DIAGNOSIS — R0989 Other specified symptoms and signs involving the circulatory and respiratory systems: Secondary | ICD-10-CM | POA: Diagnosis not present

## 2015-10-07 NOTE — Telephone Encounter (Signed)
REFILL 

## 2015-10-07 NOTE — Telephone Encounter (Signed)
Forms on Dr Birdie SonsSonnenberg desk

## 2015-10-08 NOTE — Telephone Encounter (Signed)
[facility] completed[facility]

## 2015-10-09 NOTE — Telephone Encounter (Signed)
Form faxed

## 2015-10-25 ENCOUNTER — Other Ambulatory Visit: Payer: Self-pay | Admitting: Family Medicine

## 2015-10-25 ENCOUNTER — Other Ambulatory Visit: Payer: Self-pay | Admitting: Nurse Practitioner

## 2015-10-26 ENCOUNTER — Other Ambulatory Visit: Payer: Self-pay | Admitting: Cardiology

## 2015-10-29 NOTE — Telephone Encounter (Signed)
Spoke to patient he will go to lab tomorrow to have INR checked.

## 2015-11-20 ENCOUNTER — Other Ambulatory Visit: Payer: Self-pay | Admitting: Cardiology

## 2015-11-20 ENCOUNTER — Ambulatory Visit (INDEPENDENT_AMBULATORY_CARE_PROVIDER_SITE_OTHER): Payer: Medicare Other

## 2015-11-20 ENCOUNTER — Ambulatory Visit (INDEPENDENT_AMBULATORY_CARE_PROVIDER_SITE_OTHER): Payer: Medicare Other | Admitting: Cardiology

## 2015-11-20 ENCOUNTER — Encounter: Payer: Self-pay | Admitting: Cardiology

## 2015-11-20 VITALS — BP 134/85 | HR 81 | Ht 76.0 in | Wt >= 6400 oz

## 2015-11-20 DIAGNOSIS — I8311 Varicose veins of right lower extremity with inflammation: Secondary | ICD-10-CM

## 2015-11-20 DIAGNOSIS — E119 Type 2 diabetes mellitus without complications: Secondary | ICD-10-CM

## 2015-11-20 DIAGNOSIS — I482 Chronic atrial fibrillation, unspecified: Secondary | ICD-10-CM

## 2015-11-20 DIAGNOSIS — I8312 Varicose veins of left lower extremity with inflammation: Secondary | ICD-10-CM

## 2015-11-20 DIAGNOSIS — Z7901 Long term (current) use of anticoagulants: Secondary | ICD-10-CM

## 2015-11-20 DIAGNOSIS — I2510 Atherosclerotic heart disease of native coronary artery without angina pectoris: Secondary | ICD-10-CM | POA: Diagnosis not present

## 2015-11-20 DIAGNOSIS — R0602 Shortness of breath: Secondary | ICD-10-CM

## 2015-11-20 DIAGNOSIS — I872 Venous insufficiency (chronic) (peripheral): Secondary | ICD-10-CM

## 2015-11-20 DIAGNOSIS — Z9861 Coronary angioplasty status: Secondary | ICD-10-CM

## 2015-11-20 DIAGNOSIS — R600 Localized edema: Secondary | ICD-10-CM

## 2015-11-20 DIAGNOSIS — I10 Essential (primary) hypertension: Secondary | ICD-10-CM

## 2015-11-20 DIAGNOSIS — E785 Hyperlipidemia, unspecified: Secondary | ICD-10-CM | POA: Diagnosis not present

## 2015-11-20 DIAGNOSIS — Z794 Long term (current) use of insulin: Secondary | ICD-10-CM

## 2015-11-20 LAB — POCT INR: INR: 3.7

## 2015-11-20 MED ORDER — WARFARIN SODIUM 10 MG PO TABS: ORAL_TABLET | ORAL | 1 refills | Status: DC

## 2015-11-20 NOTE — Progress Notes (Signed)
PCP: Tommi Rumps, MD  Clinic Note: Chief Complaint  Patient presents with  . Other    6 month follow up. Meds reviewed by the patient verbally. Pt. c/o shortness of breath with over exertion.   . Coronary Artery Disease    History of inferior STEMI  . Atrial Fibrillation    Chronic/persistent.    HPI: Chad Baker is a super morbidly obese 50 y.o. male with a PMH below who presents today for ~6 month f/u of CAD-PCI for Inferior STEMI.  Also has CAF on warfarin. OSA on CPAP,,HTN, HLD, ED; chronic LE edema w/ venous stasis. Jan 2016 --> Inf. STEMI - transferred from [facility] to [facility] (due to body weight) -- 100% RCA - thrombectomy, PCI with 2 very large BMS.  Mild Ischemic CM.  Chad Baker was last seen on 05/15/2015.  No weight change. Nontender cardiac rehabilitation. No active symptoms. Chronic lower extremity edema. Wears CPAP at night.  Stop Plavix. On aspirin plus warfarin.  Recommend using Ace wraps or lower extremity edema. Twice a day dosing of Lasix for several days then return to once daily.  Recent Hospitalizations: N/A  Studies Reviewed:   LEA Dopplers 2015/11/03: Ordered because of absent right pedal pulses on exam  No evidence of segmental lower extremity arterial disease at rest, bilaterally.   Cardiac arrhythmia noted throughout exam.  ABI's are falsely elevated due to medial calcification, bilaterally. 1.4 bilateral ABI  The bilateral great toe-brachial indices are normal.  1.1 bilateral TBI   Interval History:  Chad Baker presents today Doing well from a cardiac standpoint. He has his persistent lower TIMI edema which is no worse or better than it usually is. He doesn't really do a very good job wearing Ace wraps, but when he does this he swelling is better. Every now that he has a double up his Lasix maybe 2-3 days a week. Despite edema, he denies any PND or orthopnea.  He denies any resting or exertional anginal chest pain. He really doesn't do much exertion, therefore he really  has no exertional dyspnea.  If he does exert himself, he does have exertional dyspnea. Much of this could be simply due to deconditioning and obesity. He denies any sensation of atrial fibrillation. No rapid irregular heartbeats. No bleeding issues while on warfarin and Plavix.  He wears his CPAP at night, but otherwise denies any PND or orthopnea.  No palpitations, lightheadedness, dizziness, weakness or syncope/near syncope. No TIA/amaurosis fugax symptoms. No claudication.  ROS: A comprehensive was performed. Review of Systems  Constitutional: Negative for malaise/fatigue and weight loss.  HENT: Negative for nosebleeds.   Eyes: Negative for blurred vision.  Respiratory: Negative for cough, shortness of breath and wheezing.        Sleeps with CPAP  Cardiovascular: Positive for leg swelling.       Per history of present illness  Gastrointestinal: Negative for abdominal pain, blood in stool, heartburn and melena.  Genitourinary: Negative for dysuria and hematuria.  Musculoskeletal: Positive for joint pain (Knees and hips as well as back).  Neurological: Negative.  Negative for weakness and headaches.  Endo/Heme/Allergies: Bruises/bleeds easily.  Psychiatric/Behavioral: Negative.   All other systems reviewed and are negative.  PAD Screen 11/22/2015  Previous PAD dx? Yes - Venous stasis   Previous surgical procedure? No  Pain with walking? Yes - Venous stasis   Feet/toe relief with dangling? No  Painful, non-healing ulcers? Yes - Venous stasis   Extremities discolored? Yes - Venous stasis  Past Medical History:  Diagnosis Date  . Bilateral lower extremity edema - severe 04/24/2014  . CAD S/P percutaneous coronary angioplasty 04/2014   a. Inf STEMI CATH: LM nl, LAD 20, D1 nl, LCX 20p, RI nl, RCA 100% pRCA (5.75m x245m - 6.40m67meriflex BMS), 80% dRCA (4.5mm27m0mm4m5.1 mm [name] BMS);  b. 04/2014 Echo: EF 45-50%. Basal-mid inferior akinesis with basal inferoseptal akinesis. Moderate  LA dilation. RV systolic function moderately reduced with hypokinetic free wall.  . Chronic a-fib (HCC) [place]. coumadin.  . CKD (chronic kidney disease), stage IV (HCC) [place] Essential hypertension   . Family history of premature CAD 04/24/2014  . Hyperlipidemia with target LDL less than 70   . OSA (obstructive sleep apnea)   . Poorly controlled type 2 diabetes mellitus with circulatory disorder (HCC) [place] ST elevation myocardial infarction (STEMI) of inferior wall (HCC) [id number]   [facility] > [facility] Cath Table Limit (also with poor glycemic control  . [facility] 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  . Super obesity (HCC)[facility] Past Surgical History:  Procedure Laterality Date  . LEFT HEART CATHETERIZATION WITH CORONARY ANGIOGRAM N/A 04/24/2014   Procedure: LEFT HEART CATHETERIZATION WITH CORONARY ANGIOGRAM;  Surgeon: DavidGlenetta Hew [facility]; Very Large RCA -> pRCA 100%, dRCA 80%  . PERCUTANEOUS CORONARY STENT INTERVENTION (PCI-S)  04/24/2014   Aspiration Thrombectomy --> pRCA VeriFlex BMS 5.0 x 28 (6.1 mm), dRCA (@ crux) [name] BMS 4.5 x 20 (5.1 mm)  . TRANSTHORACIC ECHOCARDIOGRAM  January 2016   EF 45-50%. Basal and mid inferior akinesis and basal inferoseptal akinesis. Moderate LA dilation, RV systolic function moderately reduced with hypokinetic free wall.    Prior to Admission medications   Medication Sig Start Date End Date Taking? Authorizing Provider  allopurinol (ZYLOPRIM) 300 MG tablet TAKE 1 TABLET(300 MG) BY MOUTH DAILY 10/27/15  Yes Chad Baker atorvastatin (LIPITOR) 80 MG tablet Take 40 mg by mouth daily.   Yes Historical Provider, MD  carvedilol (COREG) 25 MG tablet TAKE 1 TABLET BY MOUTH TWICE DAILY WITH A MEAL 07/11/15  Yes Chad Baker Chromium-Cinnamon (CINNAMON PLUS CHROMIUM PO) Take 500 mg by mouth 2 (two) times daily. Reported on 05/15/2015   Yes Historical Provider, MD  citalopram  (CELEXA) 40 MG tablet Take 1 tablet (40 mg total) by mouth daily. 09/06/14  Yes CarriRubbie Battiest Cranberry-Vitamin C-Vitamin E 4200-20-3 MG-MG-UNIT CAPS Take 2 capsules by mouth 2 (two) times daily.    Yes Historical Provider, MD  ezetimibe (ZETIA) 10 MG tablet TAKE 1 TABLET BY MOUTH EVERY DAY 10/27/15  Yes Chad Baker furosemide (LASIX) 80 MG tablet TAKE 1 TABLET BY MOUTH DAILY 06/28/15  Yes CarriRubbie Battiest glimepiride (AMARYL) 4 MG tablet TAKE 1 TABLET(4 MG) BY MOUTH TWICE DAILY 12/05/14  Yes CarriRubbie Battiest glimepiride (AMARYL) 4 MG tablet TAKE 1 TABLET(4 MG) BY MOUTH TWICE DAILY 09/17/15  Yes Eric [place] glucose blood test strip 1 each by Other route as needed for other. Use as instructed with Freestyle monitor   Yes Historical Provider, MD  glucose monitoring kit (FREESTYLE) monitoring kit 1 each by Does not apply route as needed for other. 04/26/14  Yes JessiGeradine Girt hydrALAZINE (APRESOLINE) 100  MG tablet Take 1 tablet (100 mg total) by mouth 2 (two) times daily. 02/08/15  Yes Rubbie Battiest, NP  insulin lispro (HUMALOG) 100 UNIT/ML injection Inject 0.1 mLs (10 Units total) into the skin 3 (three) times daily with meals. 05/22/14  Yes Rubbie Battiest, NP  Liraglutide 18 MG/3ML SOPN Inject 0.3 mLs (1.8 mg total) into the skin daily. 04/26/14  Yes Geradine Girt, DO  nitroGLYCERIN (NITROSTAT) 0.4 MG SL tablet Place 1 tablet (0.4 mg total) under the tongue every 5 (five) minutes as needed for chest pain. 05/29/14  Yes Leonie Man, MD  spironolactone (ALDACTONE) 25 MG tablet Take 1 tablet (25 mg total) by mouth daily. 02/08/15  Yes Rubbie Battiest, NP  warfarin (COUMADIN) 10 MG tablet Take 1 tablet daily or as directed by Coumadin clinic 10/29/15  Yes Leonie Man, MD    Allergies  Allergen Reactions  . Codeine Nausea And Vomiting  . Penicillins   . Lantus [Insulin Glargine] Itching and Rash    Social History   Social History  . Marital status: Single    Spouse  name: N/A  . Number of children: N/A  . Years of education: N/A   Social History Main Topics  . Smoking status: Never Smoker  . Smokeless tobacco: Never Used  . Alcohol use No  . Drug use: No  . Sexual activity: Yes    Partners: Female     Comment: 1 partner    Other Topics Concern  . None   Social History Narrative   Originally from [place], moved to [place]. Has girlfriend in [place].      Family History  Problem Relation Age of Onset  . Heart disease Father   . Heart disease Brother     Wt Readings from Last 3 Encounters:  11/20/15 (!) 459 lb 8 oz (208.4 kg)  08/27/15 (!) 459 lb 3.2 oz (208.3 kg)  05/15/15 (!) 464 lb 8 oz (210.7 kg)    PHYSICAL EXAM BP 134/85 (BP Location: Left Arm, Patient Position: Sitting, Cuff Size: Large)   Pulse 81   Ht '6\' 4"'$  (1.93 m)   Wt (!) 459 lb 8 oz (208.4 kg)   BMI 55.93 kg/m  General appearance: alert, cooperative, appears stated age, no distress -- super morbidly obese Neck: no adenopathy, no carotid bruit and JVD NOT DETECTABLE DUE TO BODY HABITUS  Lungs: CTA B normal percussion bilaterally and non-labored Heart: Irregular/irregular rhythm with normal rate (Borderline tachycardic); distant heart sounds but normal S1 and S2;, no murmur, click, rub or gallop; unable to palpate PMI Abdomen: soft, non-tender; bowel sounds normal; no masses, no organomegaly; unable to palpate HSM Extremities: extremities normal, atraumatic, no cyanosis, and bilateral 2-3+ LEE with diffuse venous stasis discoloration - edema actually appears to be improved. Pulses: 2+ and symmetric; radial pulses, but pedal pulses difficult to palpate due to edema. Skin: notable venous stasis changes in the lower extremities but otherwise no significant rashes or lesions.  Neurologic: Mental status: Alert, oriented, thought content appropriate; Cranial nerves: normal (II-XII grossly intact)    Adult ECG Report  Rate: 81;  Rhythm: atrial fibrillation and  Controlled rate. Low voltage overall. Cannot exclude anterior/ inferior MI, age undetermined. Also poor progression in the anterior leads.;   Narrative Interpretation: No significant change from prior EKG.   Other studies Reviewed: Additional studies/ records that were reviewed today include:  Recent Labs:  No recent labs- checked by PCP Lab Results  Component Value Date  CHOL 101 06/24/2015   HDL 35 (L) 06/24/2015   LDLCALC 46 06/24/2015   TRIG 102 06/24/2015   CHOLHDL 2.9 06/24/2015    ASSESSMENT / PLAN: Problem List Items Addressed This Visit    SOB (shortness of breath) on exertion    Probably related to deconditioning, and severe obesity more than any potential cardiac etiology. He does have a mildly reduced EF, but is not having any other significant heart failure type symptoms.  Needs to continue to try to exercise.      Relevant Orders   EKG 12-Lead (Completed)   Morbid obesity with body mass index of 50 or higher (HCC) (Chronic)    He is down about 5 pounds since February, I hope that his recent nuptials would motivate him to continue to lose weight. We discussed importance of increasing exercise and dietary modifications.  Strongly consider dietary counseling.      Long-term (current) use of anticoagulants (Chronic)    Followed in Coumadin clinic. On warfarin for chronic A. fib.      Hyperlipidemia with target LDL less than 70 (Chronic)    Last lipid panel was in March. Well-controlled. HDL is better, but still not get goal. Continue Zetia plus atorvastatin. PCP is following labs. - Would be fine rechecking in March.      Essential hypertension (Chronic)    Well-controlled on current regimen of beta blocker and hydralazine, and spironolactone At this point, continue to monitor on hydralazine with his last creatinine being ~2      Diabetes mellitus, type II, insulin dependent (HCC) - Primary (Chronic)   Chronic venous stasis dermatitis of both lower  extremities (Chronic)    Unable to tolerate compression hose, therefore I recommended he continue using Ace wraps. Continue diuresis with Lasix and spironolactone. Using when necessary doses. This seems more like venous stasis as opposed to heart failure related to edema.      Chronic atrial fibrillation (HCC) -- CHA2DS2Vasc Score 5, On Warfarin (Chronic)    Rate controlled A. fib on beta blocker. No bleeding issues on warfarin. He is due for INR check      Relevant Orders   EKG 12-Lead (Completed)   CAD S/P 2 SITE BMS PCI TO PROX & DISTAL RCA -  (Chronic)    2 very large BMS stents placed in the RCA in setting of an inferior STEMI. No recurrent anginal symptoms. Now on aspirin alone. On full dose carvedilol and statin. For afterload reduction he is on hydralazine as opposed to ACE inhibitor/ARB due to renal insufficiency. However, he is also on spironolactone.      Relevant Orders   EKG 12-Lead (Completed)   Bilateral lower extremity edema - severe (Chronic)    Related to venous stasis. Ace wraps and Lasix for diuresis       Other Visit Diagnoses   None.     Current medicines are reviewed at length with the patient today. (+/- concerns) none The following changes have been made:Recommend    Studies Ordered:   Orders Placed This Encounter  Procedures  . EKG 12-Lead   Follow-Up: Your physician wants you to follow-up in:6 months with Dr. Ellyn Hack in [place].      Glenetta Hew, M.D., M.S. Interventional Cardiologist   Pager # [phone] Phone # [phone] [address] [place], [place] [address]

## 2015-11-20 NOTE — Telephone Encounter (Signed)
Medication Detail    Disp Refills Start End   warfarin (COUMADIN) 10 MG tablet 30 tablet 1 11/20/2015    Sig: Take 1 tablet daily or as directed by Coumadin clinic   Notes to Pharmacy: Unable to give pt 90 day supply, secondary to non compliance in follow-up INR checks   E-Prescribing Status: Receipt confirmed by pharmacy (11/20/2015 11:58 AM EDT)   Pharmacy   [facility] [id number] - [place], [place] - [address] [facility] [address] [facility]

## 2015-11-20 NOTE — Patient Instructions (Signed)
Medication Instructions:  Your physician recommends that you continue on your current medications as directed. Please refer to the Current Medication list given to you today.   Labwork: none  Testing/Procedures: none  Follow-Up: Your physician wants you to follow-up in:6 months with Dr. [place]bie [place] in [place].  You will receive a reminder letter in the mail two months in advance. If you don't receive a letter, please call our office to schedule the follow-up appointment.   Any Other Special Instructions Will Be Listed Below (If Applicable).     If you need a refill on your cardiac medications before your next appointment, please call your pharmacy.

## 2015-11-22 ENCOUNTER — Encounter: Payer: Self-pay | Admitting: Cardiology

## 2015-11-22 DIAGNOSIS — R0602 Shortness of breath: Secondary | ICD-10-CM | POA: Insufficient documentation

## 2015-11-22 NOTE — Assessment & Plan Note (Signed)
He is down about 5 pounds since February, I hope that his recent nuptials would motivate him to continue to lose weight. We discussed importance of increasing exercise and dietary modifications.  Strongly consider dietary counseling.

## 2015-11-22 NOTE — Assessment & Plan Note (Addendum)
Well-controlled on current regimen of beta blocker and hydralazine, and spironolactone At this point, continue to monitor on hydralazine with his last creatinine being ~2

## 2015-11-22 NOTE — Assessment & Plan Note (Signed)
2 very large BMS stents placed in the RCA in setting of an inferior STEMI. No recurrent anginal symptoms. Now on aspirin alone. On full dose carvedilol and statin. For afterload reduction he is on hydralazine as opposed to ACE inhibitor/ARB due to renal insufficiency. However, he is also on spironolactone.

## 2015-11-22 NOTE — Assessment & Plan Note (Signed)
Unable to tolerate compression hose, therefore I recommended he continue using Ace wraps. Continue diuresis with Lasix and spironolactone. Using when necessary doses. This seems more like venous stasis as opposed to heart failure related to edema.

## 2015-11-22 NOTE — Assessment & Plan Note (Signed)
Followed in Coumadin clinic. On warfarin for chronic A. fib.

## 2015-11-22 NOTE — Assessment & Plan Note (Signed)
Rate controlled A. fib on beta blocker. No bleeding issues on warfarin. He is due for INR check

## 2015-11-22 NOTE — Assessment & Plan Note (Signed)
Last lipid panel was in March. Well-controlled. HDL is better, but still not get goal. Continue Zetia plus atorvastatin. PCP is following labs. - Would be fine rechecking in March.

## 2015-11-22 NOTE — Assessment & Plan Note (Signed)
Probably related to deconditioning, and severe obesity more than any potential cardiac etiology. He does have a mildly reduced EF, but is not having any other significant heart failure type symptoms.  Needs to continue to try to exercise.

## 2015-11-22 NOTE — Assessment & Plan Note (Signed)
Related to venous stasis. Ace wraps and Lasix for diuresis

## 2015-12-03 ENCOUNTER — Ambulatory Visit (INDEPENDENT_AMBULATORY_CARE_PROVIDER_SITE_OTHER): Payer: Medicare Other | Admitting: Family Medicine

## 2015-12-03 ENCOUNTER — Encounter: Payer: Self-pay | Admitting: Family Medicine

## 2015-12-03 VITALS — BP 128/86 | HR 79 | Temp 97.9°F | Wt >= 6400 oz

## 2015-12-03 DIAGNOSIS — Z794 Long term (current) use of insulin: Secondary | ICD-10-CM

## 2015-12-03 DIAGNOSIS — E119 Type 2 diabetes mellitus without complications: Secondary | ICD-10-CM | POA: Diagnosis not present

## 2015-12-03 DIAGNOSIS — Z23 Encounter for immunization: Secondary | ICD-10-CM

## 2015-12-03 DIAGNOSIS — M109 Gout, unspecified: Secondary | ICD-10-CM | POA: Insufficient documentation

## 2015-12-03 DIAGNOSIS — R062 Wheezing: Secondary | ICD-10-CM | POA: Insufficient documentation

## 2015-12-03 DIAGNOSIS — E1159 Type 2 diabetes mellitus with other circulatory complications: Secondary | ICD-10-CM

## 2015-12-03 LAB — HEMOGLOBIN A1C: Hgb A1c MFr Bld: 8.2 % — ABNORMAL HIGH (ref 4.6–6.5)

## 2015-12-03 NOTE — Progress Notes (Signed)
  Chad AlarEric Sonnenberg, MD Phone: [phone]  Chad Baker is a 50 y.o. male who presents today for f/u.  DIABETES Disease Monitoring: Blood Sugar ranges-152 the last time he checked it. Notes it is typically lower than this. Does not check that often. Polyuria/phagia/dipsia- no      ophthalmology- has not seen yet. Medications: Compliance- taking glyburide, Victoza, Humalog. 15 units 3 times a day for the anemia on Hypoglycemic symptoms- no  Absent pedal pulse: Had ABIs. Notes these were normal.  CKD: Hasn't seen nephrology. He avoids nephrotoxic agents such as NSAIDs. Not on an ACE or ARB. Does take allopurinol though his creatinine clearance will tolerate this. Also taking Lasix.  Gout: Has not had a flare in several years. Typically gets them in his left hand. Is taking allopurinol.  Does note some wheeziness at times that is typical this time of year. No shortness of breath. No orthopnea or PND. No history of asthma. No wheezing right now. No fluid accumulation.  PMH: nonsmoker.   ROS see history of present illness  Objective  Physical Exam Vitals:   12/03/15 0915  BP: 128/86  Pulse: 79  Temp: 97.9 F (36.6 C)    BP Readings from Last 3 Encounters:  12/03/15 128/86  11/20/15 134/85  08/27/15 110/72   Wt Readings from Last 3 Encounters:  12/03/15 (!) 469 lb 12.8 oz (213.1 kg)  11/20/15 (!) 459 lb 8 oz (208.4 kg)  08/27/15 (!) 459 lb 3.2 oz (208.3 kg)    Physical Exam  Constitutional: No distress.  HENT:  Head: Normocephalic and atraumatic.  Cardiovascular: Normal rate, regular rhythm and normal heart sounds.   Pulmonary/Chest: Effort normal and breath sounds normal.  Musculoskeletal: He exhibits no edema.  Neurological: He is alert. Gait normal.  Skin: Skin is warm and dry. He is not diaphoretic.     Assessment/Plan: Please see individual problem list.  Diabetes mellitus, type II, insulin dependent Sugars appear relatively well controlled. Last A1c was not  well controlled. We will recheck A1c today. Advised to see ophthalmology.  Absent pedal pulses Had ABIs that were normal. We will continue to monitor.  Chronic kidney disease, stage IV (severe) Appointment remade with nephrology. He will continue to avoid nephrotoxic agents.  Gout No recent attacks. Continue allopurinol. Creatinine clearance will tolerate this medication.  Wheezing Patient notes chronic intermittent history of wheezing particularly this time of year. No wheezing on exam. No other symptoms to indicate specific cause. Discussed potential causes with patient and we will continue to monitor. If develops any new symptoms or persistent wheezing would consider albuterol versus PFTs.   Orders Placed This Encounter  Procedures  . Flu Vaccine QUAD 36+ mos IM  . HgB A1c    No orders of the defined types were placed in this encounter.   Chad AlarEric Sonnenberg, MD [facility] [place]

## 2015-12-03 NOTE — Assessment & Plan Note (Signed)
Sugars appear relatively well controlled. Last A1c was not well controlled. We will recheck A1c today. Advised to see ophthalmology.

## 2015-12-03 NOTE — Progress Notes (Signed)
Pre visit review using our clinic review tool, if applicable. No additional management support is needed unless otherwise documented below in the visit note. 

## 2015-12-03 NOTE — Patient Instructions (Signed)
Nice to see you. We will check an A1c today. You need to see the eye doctor. We will try to reschedule your nephrology appointment. Please monitor the wheeziness. If he develops shortness of breath, cough, or any new or changing symptoms with this please seek medical attention.

## 2015-12-03 NOTE — Assessment & Plan Note (Signed)
Patient notes chronic intermittent history of wheezing particularly this time of year. No wheezing on exam. No other symptoms to indicate specific cause. Discussed potential causes with patient and we will continue to monitor. If develops any new symptoms or persistent wheezing would consider albuterol versus PFTs.

## 2015-12-03 NOTE — Assessment & Plan Note (Signed)
No recent attacks. Continue allopurinol. Creatinine clearance will tolerate this medication.

## 2015-12-03 NOTE — Assessment & Plan Note (Signed)
Had ABIs that were normal. We will continue to monitor.

## 2015-12-03 NOTE — Assessment & Plan Note (Signed)
Appointment remade with nephrology. He will continue to avoid nephrotoxic agents.

## 2015-12-10 ENCOUNTER — Other Ambulatory Visit: Payer: Self-pay | Admitting: Nurse Practitioner

## 2015-12-11 ENCOUNTER — Ambulatory Visit (INDEPENDENT_AMBULATORY_CARE_PROVIDER_SITE_OTHER): Payer: Medicare Other

## 2015-12-11 DIAGNOSIS — I482 Chronic atrial fibrillation, unspecified: Secondary | ICD-10-CM

## 2015-12-11 DIAGNOSIS — Z7901 Long term (current) use of anticoagulants: Secondary | ICD-10-CM | POA: Diagnosis not present

## 2015-12-11 LAB — POCT INR: INR: 1.4

## 2015-12-25 ENCOUNTER — Ambulatory Visit (INDEPENDENT_AMBULATORY_CARE_PROVIDER_SITE_OTHER): Payer: Medicare Other | Admitting: *Deleted

## 2015-12-25 DIAGNOSIS — I482 Chronic atrial fibrillation, unspecified: Secondary | ICD-10-CM

## 2015-12-25 DIAGNOSIS — Z7901 Long term (current) use of anticoagulants: Secondary | ICD-10-CM

## 2015-12-25 LAB — POCT INR: INR: 1.4

## 2016-01-06 ENCOUNTER — Telehealth: Payer: Self-pay | Admitting: *Deleted

## 2016-01-06 NOTE — Telephone Encounter (Signed)
LM for patient to return call to find out the what specifically he is needing. What size and what it is for

## 2016-01-06 NOTE — Telephone Encounter (Signed)
Pt requested to have a Rx for syringes  Pharmacy [facility] [place]

## 2016-01-07 MED ORDER — "SYRINGE/NEEDLE (DISP) 28G X 1/2"" 1 ML MISC": 1.0000 [IU]/h | Freq: Three times a day (TID) | 1 refills | Status: AC

## 2016-01-07 NOTE — Telephone Encounter (Signed)
Syringes sent to pharmacy.

## 2016-01-07 NOTE — Telephone Encounter (Signed)
Pt is running out of insulin supplies. At work not sure exactly what size the syringes are. Pt will be work, he asked if you can call him after 2 pm.  Call pt @ [phone] call after 2 pm

## 2016-01-08 ENCOUNTER — Ambulatory Visit (INDEPENDENT_AMBULATORY_CARE_PROVIDER_SITE_OTHER): Payer: Medicare Other

## 2016-01-08 DIAGNOSIS — I482 Chronic atrial fibrillation, unspecified: Secondary | ICD-10-CM

## 2016-01-08 DIAGNOSIS — Z7901 Long term (current) use of anticoagulants: Secondary | ICD-10-CM

## 2016-01-08 LAB — POCT INR: INR: 3.2

## 2016-02-11 ENCOUNTER — Other Ambulatory Visit: Payer: Self-pay | Admitting: Internal Medicine

## 2016-02-13 IMAGING — CR DG CHEST 1V PORT
1 series · 2 of 2 positions shown · non-contrast
Comparison: None.

CLINICAL DATA: Dizziness and chest pain

EXAM:
PORTABLE CHEST - 1 VIEW

[Series 1: ap · 0.17mm/px · 2 of 2 slices shown]
[im 1/2]
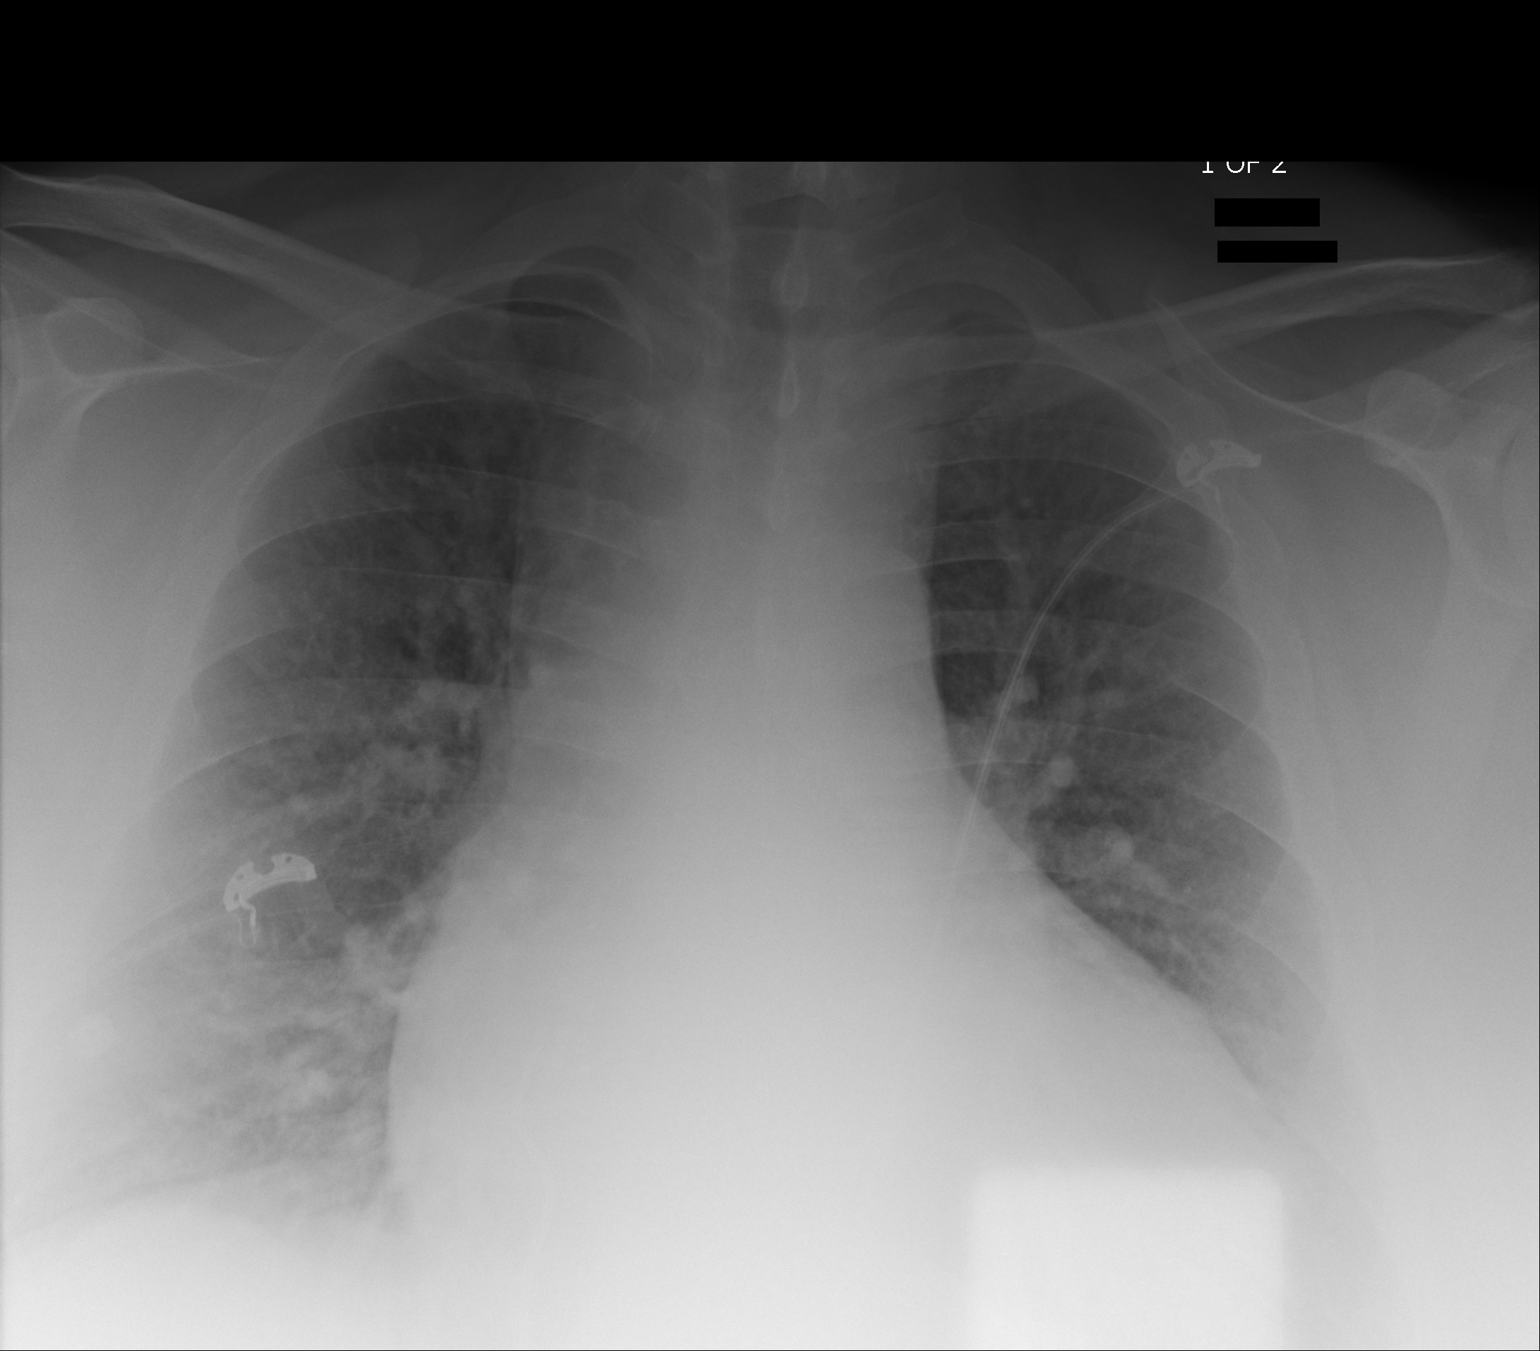
[im 2/2]
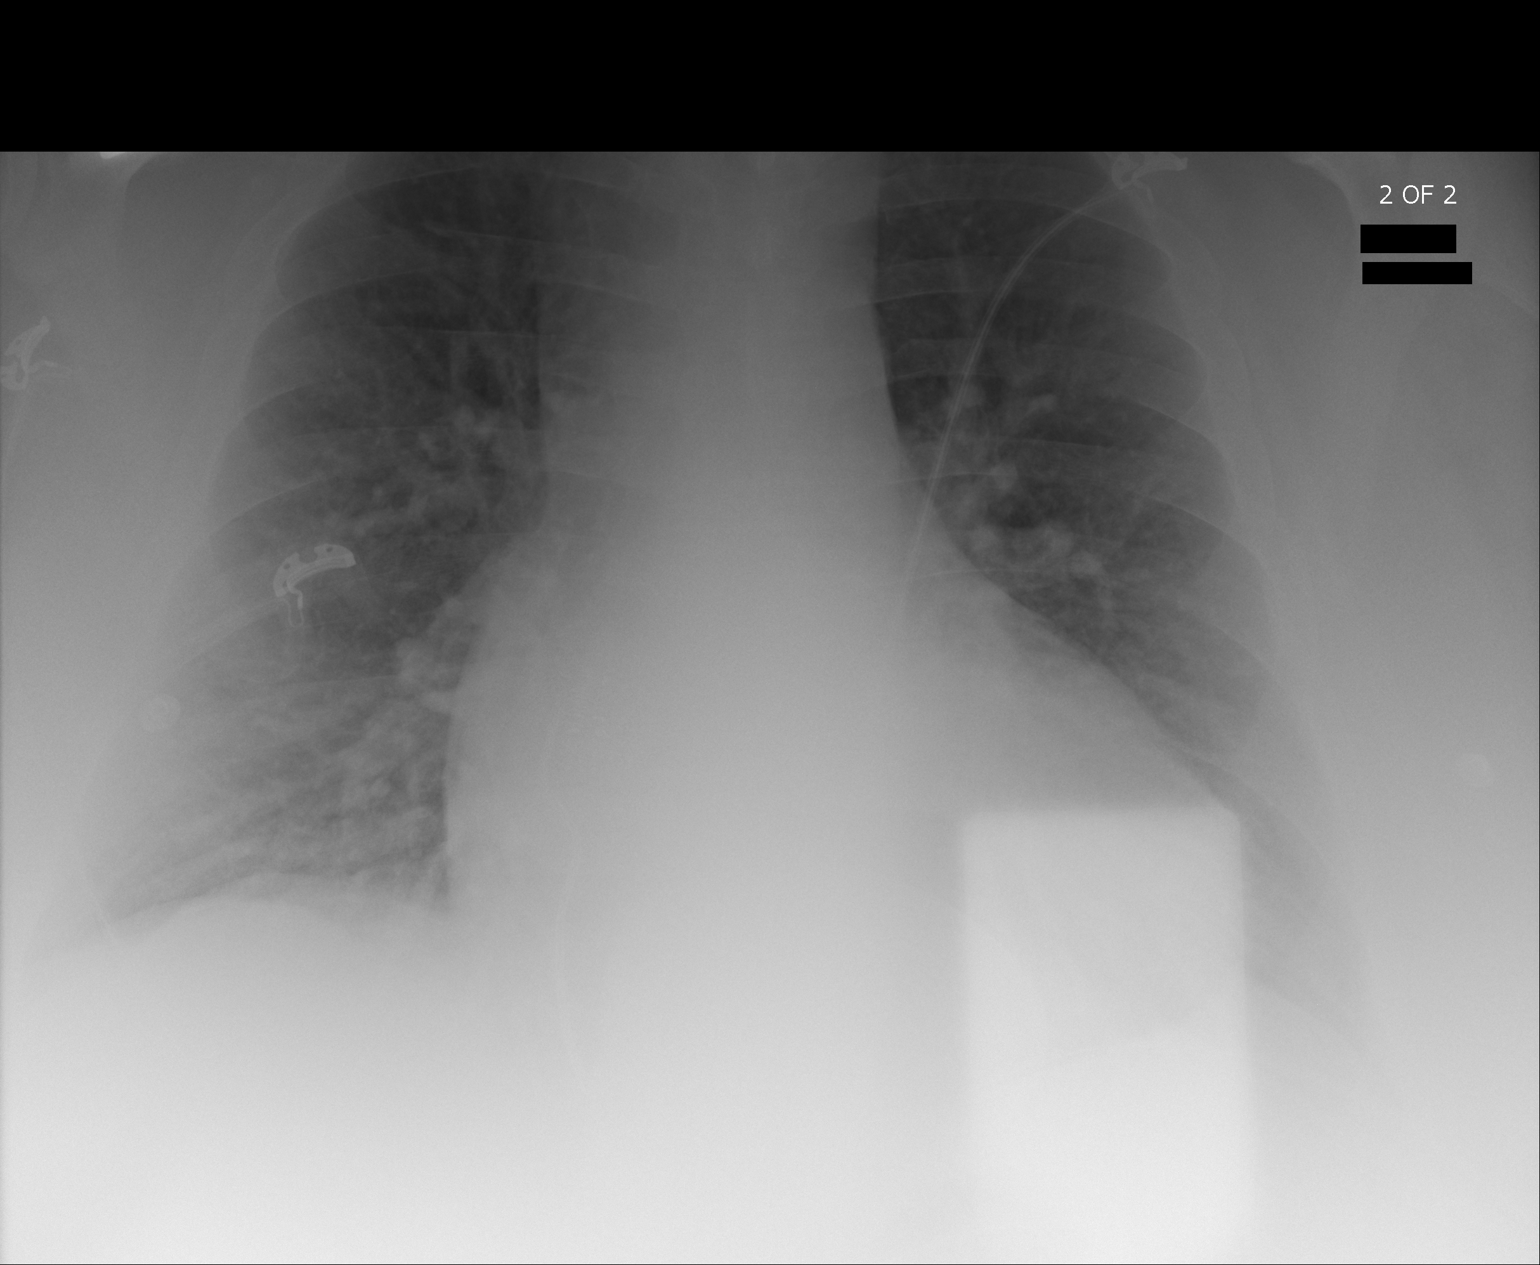

[2 of 2 positions shown; findings below may reference images not displayed]

FINDINGS: Cardiac shadow is enlarged. Mild vascular congestion is noted with
interstitial edema. No focal infiltrate or sizable effusion is seen.
IMPRESSION: Changes consistent with mild CHF.

## 2016-03-03 ENCOUNTER — Other Ambulatory Visit: Payer: Self-pay | Admitting: Cardiology

## 2016-03-05 ENCOUNTER — Telehealth: Payer: Self-pay | Admitting: Family Medicine

## 2016-03-05 ENCOUNTER — Encounter: Payer: Self-pay | Admitting: Family Medicine

## 2016-03-05 ENCOUNTER — Ambulatory Visit (INDEPENDENT_AMBULATORY_CARE_PROVIDER_SITE_OTHER): Payer: Medicare Other | Admitting: Family Medicine

## 2016-03-05 ENCOUNTER — Other Ambulatory Visit: Payer: Self-pay | Admitting: Cardiology

## 2016-03-05 VITALS — BP 130/86 | HR 71 | Temp 98.4°F | Wt >= 6400 oz

## 2016-03-05 DIAGNOSIS — Z5181 Encounter for therapeutic drug level monitoring: Secondary | ICD-10-CM

## 2016-03-05 DIAGNOSIS — E119 Type 2 diabetes mellitus without complications: Secondary | ICD-10-CM

## 2016-03-05 DIAGNOSIS — I10 Essential (primary) hypertension: Secondary | ICD-10-CM

## 2016-03-05 DIAGNOSIS — E1159 Type 2 diabetes mellitus with other circulatory complications: Secondary | ICD-10-CM | POA: Diagnosis not present

## 2016-03-05 DIAGNOSIS — N183 Chronic kidney disease, stage 3 unspecified: Secondary | ICD-10-CM

## 2016-03-05 DIAGNOSIS — E1122 Type 2 diabetes mellitus with diabetic chronic kidney disease: Secondary | ICD-10-CM

## 2016-03-05 DIAGNOSIS — Z794 Long term (current) use of insulin: Secondary | ICD-10-CM

## 2016-03-05 DIAGNOSIS — I872 Venous insufficiency (chronic) (peripheral): Secondary | ICD-10-CM

## 2016-03-05 DIAGNOSIS — I482 Chronic atrial fibrillation, unspecified: Secondary | ICD-10-CM

## 2016-03-05 DIAGNOSIS — N184 Chronic kidney disease, stage 4 (severe): Secondary | ICD-10-CM

## 2016-03-05 LAB — COMPREHENSIVE METABOLIC PANEL
ALT: 31 U/L (ref 0–53)
AST: 15 U/L (ref 0–37)
Albumin: 3.6 g/dL (ref 3.5–5.2)
Alkaline Phosphatase: 215 U/L — ABNORMAL HIGH (ref 39–117)
BUN: 49 mg/dL — ABNORMAL HIGH (ref 6–23)
CO2: 26 mEq/L (ref 19–32)
Calcium: 9 mg/dL (ref 8.4–10.5)
Chloride: 106 mEq/L (ref 96–112)
Creatinine, Ser: 2.36 mg/dL — ABNORMAL HIGH (ref 0.40–1.50)
GFR: 31.28 mL/min — ABNORMAL LOW (ref >60.00)
Glucose, Bld: 196 mg/dL — ABNORMAL HIGH (ref 70–99)
Potassium: 4.1 mEq/L (ref 3.5–5.1)
Sodium: 139 mEq/L (ref 135–145)
Total Bilirubin: 1.1 mg/dL (ref 0.2–1.2)
Total Protein: 6.2 g/dL (ref 6.0–8.3)

## 2016-03-05 LAB — HEMOGLOBIN A1C: Hgb A1c MFr Bld: 8.5 % — ABNORMAL HIGH (ref 4.6–6.5)

## 2016-03-05 LAB — PROTIME-INR
INR: 1.2 ratio — ABNORMAL HIGH (ref 0.8–1.0)
Prothrombin Time: 12.4 s (ref 9.6–13.1)

## 2016-03-05 MED ORDER — SPIRONOLACTONE 25 MG PO TABS: 25.0000 mg | ORAL_TABLET | Freq: Every day | ORAL | 1 refills | Status: DC

## 2016-03-05 NOTE — Telephone Encounter (Signed)
Attempted to call patient. No answer. Left message asking him to call back to the office. When he calls back please let them know his INR is 1.2. He needs to resume his Coumadin. He is needs to have this rechecked next week through the Coumadin clinic. He needs to set this up. His A1c is worse than previously. He would benefit from starting on Levemir. Please see if he is willing to start on this. His kidney function is also slightly worse. I would suggest that he sees a nephrologist and I can place referral if he is willing to do this. Thanks.

## 2016-03-05 NOTE — Assessment & Plan Note (Addendum)
Somewhat improved. Continue to monitor.

## 2016-03-05 NOTE — Progress Notes (Signed)
Pre visit review using our clinic review tool, if applicable. No additional management support is needed unless otherwise documented below in the visit note. 

## 2016-03-05 NOTE — Assessment & Plan Note (Signed)
CBGs appear to be well controlled at home. We'll check an A1c today. He'll continue his current medications.

## 2016-03-05 NOTE — Assessment & Plan Note (Signed)
Patient has been out of his Coumadin for a couple of days. Encouraged him to pick this up from the pharmacy and restart it. We'll check an INR today.

## 2016-03-05 NOTE — Progress Notes (Signed)
  Tommi Rumps, MD Phone: [phone]  Chad Baker is a 50 y.o. male who presents today for follow-up.  DIABETES Disease Monitoring: Blood Sugar ranges-upper 140s Polyuria/phagia/dipsia- no      optho- has not seen yet, reports he is going to see them soon Medications: Compliance- taking Humalog 15 units 3 times a day, Victoza, and glyburide Hypoglycemic symptoms- no  CKD stage IV: Has not seen nephrology. Avoids NSAIDs. Stays well-hydrated.  HYPERTENSION  Disease Monitoring  Home BP Monitoring not checking Chest pain- no    Dyspnea- no Medications  Compliance-  ran out of his spironolactone about a week ago. Taking hydralazine, Lasix, and Coreg.  Edema- chronic and stable, somewhat better over the last several days  Patient is currently on Coumadin followed by the Coumadin clinic. Notes he's been out of this for the last couple of days. It appears to have been several months since his last INR check. No palpitations.   PMH: nonsmoker.   ROS see history of present illness  Objective  Physical Exam Vitals:   03/05/16 1000  BP: 130/86  Pulse: 71  Temp: 98.4 F (36.9 C)    BP Readings from Last 3 Encounters:  03/05/16 130/86  12/03/15 128/86  11/20/15 134/85   Wt Readings from Last 3 Encounters:  03/05/16 (!) 471 lb (213.6 kg)  12/03/15 (!) 469 lb 12.8 oz (213.1 kg)  11/20/15 (!) 459 lb 8 oz (208.4 kg)    Physical Exam  Constitutional: No distress.  Cardiovascular: Normal rate and normal heart sounds.  An irregularly irregular rhythm present.  Pulmonary/Chest: Effort normal and breath sounds normal.  Musculoskeletal: He exhibits edema (swelling somewhat improved).  Neurological: He is alert.  Skin: Skin is warm and dry. He is not diaphoretic.     Assessment/Plan: Please see individual problem list.  Essential hypertension At goal today. We'll refill his spironolactone. Check CMP. Continue other medications.  Diabetes mellitus, type II, insulin  dependent CBGs appear to be well controlled at home. We'll check an A1c today. He'll continue his current medications.  Chronic venous stasis dermatitis of both lower extremities Somewhat improved. Continue to monitor.  Chronic kidney disease, stage IV (severe) Has not seen nephrology yet. We'll check his kidney function today. Avoid nephrotoxic agents.  Chronic atrial fibrillation (HCC) -- CHA2DS2Vasc Score 5, On Warfarin Patient has been out of his Coumadin for a couple of days. Encouraged him to pick this up from the pharmacy and restart it. We'll check an INR today.   Orders Placed This Encounter  Procedures  . HgB A1c  . Comp Met (CMET)  . INR/PT    Meds ordered this encounter  Medications  . spironolactone (ALDACTONE) 25 MG tablet    Sig: Take 1 tablet (25 mg total) by mouth daily.    Dispense:  90 tablet    Refill:  1   Tommi Rumps, MD [place]

## 2016-03-05 NOTE — Assessment & Plan Note (Signed)
Has not seen nephrology yet. We'll check his kidney function today. Avoid nephrotoxic agents.

## 2016-03-05 NOTE — Assessment & Plan Note (Signed)
At goal today. We'll refill his spironolactone. Check CMP. Continue other medications.

## 2016-03-05 NOTE — Patient Instructions (Signed)
Nice to see you. We are going to refill your spironolactone. Please go pick up your warfarin. We will check lab work today and call you with the results. If you develop any chest pain, palpitations, shortness of breath, or any new or changing symptoms please seek medical attention.

## 2016-03-06 NOTE — Telephone Encounter (Signed)
LM for patient to call the office

## 2016-03-11 NOTE — Telephone Encounter (Signed)
Please attempt to contact the patient regarding this message again. Thanks.

## 2016-03-12 NOTE — Telephone Encounter (Signed)
Spoke with patient and notified of lab results. He is fine with starting the Levemir. He will call and schedule with the coumadin clinic. He is also fine with the referral.

## 2016-03-12 NOTE — Telephone Encounter (Signed)
Please determine if the patient has taken Levemir before. It appears that he has a Lantus allergy. Thanks.

## 2016-03-13 ENCOUNTER — Telehealth: Payer: Self-pay | Admitting: Pharmacist

## 2016-03-13 ENCOUNTER — Telehealth: Payer: Self-pay | Admitting: Family Medicine

## 2016-03-13 MED ORDER — WARFARIN SODIUM 10 MG PO TABS: ORAL_TABLET | ORAL | 0 refills | Status: DC

## 2016-03-13 NOTE — Telephone Encounter (Signed)
Spoke to pt and appt made for 03/25/16. He is unable to come on 03/18/16 due to work. Short supply of coumadin sent to get him through until visit.

## 2016-03-13 NOTE — Telephone Encounter (Signed)
Patient stated that he has never taken Levemir.

## 2016-03-13 NOTE — Telephone Encounter (Signed)
LMTCB to follow up with pt as overdue for INR check.

## 2016-03-13 NOTE — Telephone Encounter (Signed)
Spoke with patient. See other phone note.

## 2016-03-13 NOTE — Addendum Note (Signed)
Addended by: Levin BaconAUTEN, Marirose Deveney M on: 03/13/2016 04:29 PM   Modules accepted: Orders

## 2016-03-13 NOTE — Telephone Encounter (Signed)
Pt called back returning your call. Thank you!  Call pt @ [phone]

## 2016-03-14 ENCOUNTER — Other Ambulatory Visit: Payer: Self-pay | Admitting: Nurse Practitioner

## 2016-03-16 NOTE — Telephone Encounter (Signed)
Please advise on refills.  

## 2016-03-17 NOTE — Telephone Encounter (Signed)
I would suggest we try the patient on levemir to see if we can get his diabetes under better control. Please see if he isilling to start on this. Thanks.

## 2016-03-18 MED ORDER — INSULIN DETEMIR 100 UNIT/ML FLEXPEN: 10.0000 [IU] | PEN_INJECTOR | Freq: Every day | SUBCUTANEOUS | 1 refills | Status: AC

## 2016-03-18 NOTE — Telephone Encounter (Signed)
Levemir sent to pharmacy. Please inform patient.

## 2016-03-18 NOTE — Addendum Note (Signed)
Addended by: Glori LuisSONNENBERG, Christella App G on: 03/18/2016 07:25 PM   Modules accepted: Orders

## 2016-03-18 NOTE — Telephone Encounter (Signed)
Patient stated that he was fine with starting levemir.

## 2016-03-19 NOTE — Telephone Encounter (Signed)
Refill sent to pharmacy. Called and spoke with patient to confirm that he's been taking these.

## 2016-03-25 ENCOUNTER — Other Ambulatory Visit: Payer: Self-pay | Admitting: Cardiology

## 2016-03-25 ENCOUNTER — Ambulatory Visit (INDEPENDENT_AMBULATORY_CARE_PROVIDER_SITE_OTHER): Payer: Medicare Other

## 2016-03-25 DIAGNOSIS — Z7901 Long term (current) use of anticoagulants: Secondary | ICD-10-CM

## 2016-03-25 DIAGNOSIS — I482 Chronic atrial fibrillation, unspecified: Secondary | ICD-10-CM

## 2016-03-25 LAB — POCT INR: INR: 2.7

## 2016-03-25 MED ORDER — WARFARIN SODIUM 10 MG PO TABS: ORAL_TABLET | ORAL | 1 refills | Status: DC

## 2016-03-27 ENCOUNTER — Other Ambulatory Visit: Payer: Self-pay | Admitting: Family Medicine

## 2016-03-31 DIAGNOSIS — I10 Essential (primary) hypertension: Secondary | ICD-10-CM | POA: Diagnosis not present

## 2016-03-31 DIAGNOSIS — N2581 Secondary hyperparathyroidism of renal origin: Secondary | ICD-10-CM | POA: Diagnosis not present

## 2016-03-31 DIAGNOSIS — E1122 Type 2 diabetes mellitus with diabetic chronic kidney disease: Secondary | ICD-10-CM | POA: Diagnosis not present

## 2016-03-31 DIAGNOSIS — N184 Chronic kidney disease, stage 4 (severe): Secondary | ICD-10-CM | POA: Diagnosis not present

## 2016-04-20 ENCOUNTER — Other Ambulatory Visit: Payer: Self-pay | Admitting: Cardiology

## 2016-04-29 ENCOUNTER — Ambulatory Visit (INDEPENDENT_AMBULATORY_CARE_PROVIDER_SITE_OTHER): Payer: Medicare Other

## 2016-04-29 DIAGNOSIS — Z7901 Long term (current) use of anticoagulants: Secondary | ICD-10-CM | POA: Diagnosis not present

## 2016-04-29 DIAGNOSIS — I482 Chronic atrial fibrillation, unspecified: Secondary | ICD-10-CM

## 2016-04-29 LAB — POCT INR: INR: 3.4

## 2016-05-18 ENCOUNTER — Other Ambulatory Visit: Payer: Self-pay | Admitting: Family Medicine

## 2016-05-18 ENCOUNTER — Other Ambulatory Visit: Payer: Self-pay | Admitting: Cardiology

## 2016-05-20 ENCOUNTER — Ambulatory Visit (INDEPENDENT_AMBULATORY_CARE_PROVIDER_SITE_OTHER): Payer: Medicare Other

## 2016-05-20 DIAGNOSIS — Z7901 Long term (current) use of anticoagulants: Secondary | ICD-10-CM | POA: Diagnosis not present

## 2016-05-20 DIAGNOSIS — I482 Chronic atrial fibrillation, unspecified: Secondary | ICD-10-CM

## 2016-05-20 LAB — POCT INR: INR: 3

## 2016-06-03 ENCOUNTER — Encounter: Payer: Self-pay | Admitting: Family Medicine

## 2016-06-03 ENCOUNTER — Ambulatory Visit (INDEPENDENT_AMBULATORY_CARE_PROVIDER_SITE_OTHER): Payer: Medicare Other | Admitting: Family Medicine

## 2016-06-03 VITALS — BP 132/90 | HR 78 | Temp 97.8°F | Wt >= 6400 oz

## 2016-06-03 DIAGNOSIS — N184 Chronic kidney disease, stage 4 (severe): Secondary | ICD-10-CM | POA: Diagnosis not present

## 2016-06-03 DIAGNOSIS — Z794 Long term (current) use of insulin: Secondary | ICD-10-CM

## 2016-06-03 DIAGNOSIS — E119 Type 2 diabetes mellitus without complications: Secondary | ICD-10-CM

## 2016-06-03 DIAGNOSIS — I10 Essential (primary) hypertension: Secondary | ICD-10-CM | POA: Diagnosis not present

## 2016-06-03 DIAGNOSIS — M898X1 Other specified disorders of bone, shoulder: Secondary | ICD-10-CM | POA: Diagnosis not present

## 2016-06-03 DIAGNOSIS — E785 Hyperlipidemia, unspecified: Secondary | ICD-10-CM | POA: Diagnosis not present

## 2016-06-03 MED ORDER — LIRAGLUTIDE 18 MG/3ML ~~LOC~~ SOPN: 1.8000 mg | PEN_INJECTOR | Freq: Every day | SUBCUTANEOUS | 3 refills | Status: AC

## 2016-06-03 NOTE — Patient Instructions (Signed)
Nice to see you. We will have you return for fasting lab work in about a week. You can do heat and ice for your left shoulder blade. You may take Tylenol for this as well. I have included some exercises for you to complete as well.   Shoulder Exercises Ask your health care provider which exercises are safe for you. Do exercises exactly as told by your health care provider and adjust them as directed. It is normal to feel mild stretching, pulling, tightness, or discomfort as you do these exercises, but you should stop right away if you feel sudden pain or your pain gets worse.Do not begin these exercises until told by your health care provider. RANGE OF MOTION EXERCISES  These exercises warm up your muscles and joints and improve the movement and flexibility of your shoulder. These exercises also help to relieve pain, numbness, and tingling. These exercises involve stretching your injured shoulder directly. Exercise A: Pendulum   1. Stand near a wall or a surface that you can hold onto for balance. 2. Bend at the waist and let your left / right arm hang straight down. Use your other arm to support you. Keep your back straight and do not lock your knees. 3. Relax your left / right arm and shoulder muscles, and move your hips and your trunk so your left / right arm swings freely. Your arm should swing because of the motion of your body, not because you are using your arm or shoulder muscles. 4. Keep moving your body so your arm swings in the following directions, as told by your health care provider:  Side to side.  Forward and backward.  In clockwise and counterclockwise circles. 5. Continue each motion for __________ seconds, or for as long as told by your health care provider. 6. Slowly return to the starting position. Repeat __________ times. Complete this exercise __________ times a day. Exercise B:Flexion, Standing   1. Stand and hold a broomstick, a cane, or a similar object. Place  your hands a little more than shoulder-width apart on the object. Your left / right hand should be palm-up, and your other hand should be palm-down. 2. Keep your elbow straight and keep your shoulder muscles relaxed. Push the stick down with your healthy arm to raise your left / right arm in front of your body, and then over your head until you feel a stretch in your shoulder.  Avoid shrugging your shoulder while you raise your arm. Keep your shoulder blade tucked down toward the middle of your back. 3. Hold for __________ seconds. 4. Slowly return to the starting position. Repeat __________ times. Complete this exercise __________ times a day. Exercise C: Abduction, Standing  1. Stand and hold a broomstick, a cane, or a similar object. Place your hands a little more than shoulder-width apart on the object. Your left / right hand should be palm-up, and your other hand should be palm-down. 2. While keeping your elbow straight and your shoulder muscles relaxed, push the stick across your body toward your left / right side. Raise your left / right arm to the side of your body and then over your head until you feel a stretch in your shoulder.  Do not raise your arm above shoulder height, unless your health care provider tells you to do that.  Avoid shrugging your shoulder while you raise your arm. Keep your shoulder blade tucked down toward the middle of your back. 3. Hold for __________ seconds. 4. Slowly  return to the starting position. Repeat __________ times. Complete this exercise __________ times a day. Exercise D:Internal Rotation   1. Place your left / right hand behind your back, palm-up. 2. Use your other hand to dangle an exercise band, a towel, or a similar object over your shoulder. Grasp the band with your left / right hand so you are holding onto both ends. 3. Gently pull up on the band until you feel a stretch in the front of your left / right shoulder.  Avoid shrugging your  shoulder while you raise your arm. Keep your shoulder blade tucked down toward the middle of your back. 4. Hold for __________ seconds. 5. Release the stretch by letting go of the band and lowering your hands. Repeat __________ times. Complete this exercise __________ times a day. STRETCHING EXERCISES  These exercises warm up your muscles and joints and improve the movement and flexibility of your shoulder. These exercises also help to relieve pain, numbness, and tingling. These exercises are done using your healthy shoulder to help stretch the muscles of your injured shoulder. Exercise E: [employer] officer, political party (External Rotation and Abduction)   1. Stand in a doorway with one of your feet slightly in front of the other. This is called a staggered stance. If you cannot reach your forearms to the door frame, stand facing a corner of a room. 2. Choose one of the following positions as told by your health care provider:  Place your hands and forearms on the door frame above your head.  Place your hands and forearms on the door frame at the height of your head.  Place your hands on the door frame at the height of your elbows. 3. Slowly move your weight onto your front foot until you feel a stretch across your chest and in the front of your shoulders. Keep your head and chest upright and keep your abdominal muscles tight. 4. Hold for __________ seconds. 5. To release the stretch, shift your weight to your back foot. Repeat __________ times. Complete this stretch __________ times a day. Exercise F:Extension, Standing  1. Stand and hold a broomstick, a cane, or a similar object behind your back.  Your hands should be a little wider than shoulder-width apart.  Your palms should face away from your back. 2. Keeping your elbows straight and keeping your shoulder muscles relaxed, move the stick away from your body until you feel a stretch in your shoulder.  Avoid shrugging your shoulders while you move  the stick. Keep your shoulder blade tucked down toward the middle of your back. 3. Hold for __________ seconds. 4. Slowly return to the starting position. Repeat __________ times. Complete this exercise __________ times a day. STRENGTHENING EXERCISES  These exercises build strength and endurance in your shoulder. Endurance is the ability to use your muscles for a long time, even after they get tired. Exercise G:External Rotation   1. Sit in a stable chair without armrests. 2. Secure an exercise band at elbow height on your left / right side. 3. Place a soft object, such as a folded towel or a small pillow, between your left / right upper arm and your body to move your elbow a few inches away (about 10 cm) from your side. 4. Hold the end of the band so it is tight and there is no slack. 5. Keeping your elbow pressed against the soft object, move your left / right forearm out, away from your abdomen. Keep your body steady so  only your forearm moves. 6. Hold for __________ seconds. 7. Slowly return to the starting position. Repeat __________ times. Complete this exercise __________ times a day. Exercise H:Shoulder Abduction   1. Sit in a stable chair without armrests, or stand. 2. Hold a __________ weight in your left / right hand, or hold an exercise band with both hands. 3. Start with your arms straight down and your left / right palm facing in, toward your body. 4. Slowly lift your left / right hand out to your side. Do not lift your hand above shoulder height unless your health care provider tells you that this is safe.  Keep your arms straight.  Avoid shrugging your shoulder while you do this movement. Keep your shoulder blade tucked down toward the middle of your back. 5. Hold for __________ seconds. 6. Slowly lower your arm, and return to the starting position. Repeat __________ times. Complete this exercise __________ times a day. Exercise I:Shoulder Extension  1. Sit in a  stable chair without armrests, or stand. 2. Secure an exercise band to a stable object in front of you where it is at shoulder height. 3. Hold one end of the exercise band in each hand. Your palms should face each other. 4. Straighten your elbows and lift your hands up to shoulder height. 5. Step back, away from the secured end of the exercise band, until the band is tight and there is no slack. 6. Squeeze your shoulder blades together as you pull your hands down to the sides of your thighs. Stop when your hands are straight down by your sides. Do not let your hands go behind your body. 7. Hold for __________ seconds. 8. Slowly return to the starting position. Repeat __________ times. Complete this exercise __________ times a day. Exercise J:Standing Shoulder Row  1. Sit in a stable chair without armrests, or stand. 2. Secure an exercise band to a stable object in front of you so it is at waist height. 3. Hold one end of the exercise band in each hand. Your palms should be in a thumbs-up position. 4. Bend each of your elbows to an "L" shape (about 90 degrees) and keep your upper arms at your sides. 5. Step back until the band is tight and there is no slack. 6. Slowly pull your elbows back behind you. 7. Hold for __________ seconds. 8. Slowly return to the starting position. Repeat __________ times. Complete this exercise __________ times a day. Exercise K:Shoulder Press-Ups   1. Sit in a stable chair that has armrests. Sit upright, with your feet flat on the floor. 2. Put your hands on the armrests so your elbows are bent and your fingers are pointing forward. Your hands should be about even with the sides of your body. 3. Push down on the armrests and use your arms to lift yourself off of the chair. Straighten your elbows and lift yourself up as much as you comfortably can.  Move your shoulder blades down, and avoid letting your shoulders move up toward your ears.  Keep your feet on the  ground. As you get stronger, your feet should support less of your body weight as you lift yourself up. 4. Hold for __________ seconds. 5. Slowly lower yourself back into the chair. Repeat __________ times. Complete this exercise __________ times a day. Exercise L: Wall Push-Ups   1. Stand so you are facing a stable wall. Your feet should be about one arm-length away from the wall. 2. Lean forward  and place your palms on the wall at shoulder height. 3. Keep your feet flat on the floor as you bend your elbows and lean forward toward the wall. 4. Hold for __________ seconds. 5. Straighten your elbows to push yourself back to the starting position. Repeat __________ times. Complete this exercise __________ times a day. This information is not intended to replace advice given to you by your health care provider. Make sure you discuss any questions you have with your health care provider. Document Released: 02/04/2005 Document Revised: 12/16/2015 Document Reviewed: 12/02/2014 Elsevier Interactive Patient Education  2017 [employer]

## 2016-06-03 NOTE — Assessment & Plan Note (Signed)
Plan to check A1c next week with fasting lab work. Continue current medications at this time.

## 2016-06-03 NOTE — Progress Notes (Signed)
Tommi Rumps, MD Phone: [phone]  Chad Baker is a 51 y.o. male who presents today for f/u.  HYPERTENSION Disease Monitoring: Blood pressure range-not checking Chest pain- no      Dyspnea- no Medications: Compliance- taking coreg, lasix, hydralazine, spironolactone   Edema- stable and unchanged from venous insufficiency  DIABETES Disease Monitoring: Blood Sugar ranges-not checking Polyuria/phagia/dipsia- no      ophthalmology-states he saw last month and reports no diabetic eye changes. Medications: Compliance- taking amaryl, humalog 10 u TID, levemir 10 u nightly, victoza Hypoglycemic symptoms- no  HYPERLIPIDEMIA Disease Monitoring: See symptoms for Hypertension Medications: Compliance- taking lipitor, zetia Right upper quadrant pain- no  Muscle aches- no  Patient does note some left upper shoulder blade discomfort. Only occurs when he stands still. Does not bother him when he moves his shoulder. No neck pain. Does occasionally radiate down his left lateral arm. No numbness or weakness with this. No chest pain or shortness of breath. Patient does report previously when they started him on warfarin he had a bleed into the muscles surrounding his scapula.  Patient saw nephrology recently. They added vitamin D and a potassium binder. They did not change any other medicines per patient report.   PMH: nonsmoker.   ROS see history of present illness  Objective  Physical Exam Vitals:   06/03/16 0843  BP: 132/90  Pulse: 78  Temp: 97.8 F (36.6 C)    BP Readings from Last 3 Encounters:  06/03/16 132/90  03/05/16 130/86  12/03/15 128/86   Wt Readings from Last 3 Encounters:  06/03/16 (!) 458 lb 6.4 oz (207.9 kg)  03/05/16 (!) 471 lb (213.6 kg)  12/03/15 (!) 469 lb 12.8 oz (213.1 kg)    Physical Exam  Constitutional: No distress.  Cardiovascular: Normal rate and normal heart sounds.  An irregularly irregular rhythm present.  Pulmonary/Chest: Effort normal and  breath sounds normal.  Musculoskeletal:  Patient with tenderness along the supraspinatus muscle with some spasm on the left side, no other tenderness over the scapula, no midline spine tenderness, no midline spine step-off, no other muscular back tenderness  Neurological: He is alert. Gait normal.  5/5 strength in bilateral biceps, triceps, grip, quads, hamstrings, plantar and dorsiflexion, sensation to light touch intact in bilateral UE and LE  Skin: Skin is warm and dry. He is not diaphoretic.     Assessment/Plan: Please see individual problem list.  Hyperlipidemia with target LDL less than 70 Tolerating Lipitor and Zetia. Plan to check lipid panel fasting lab work in a week or so.  Chronic kidney disease, stage IV (severe) Continue to follow with nephrology.  Diabetes mellitus, type II, insulin dependent Plan to check A1c next week with fasting lab work. Continue current medications at this time.  Essential hypertension Blood pressure remained stable. Continue current medications. CMP next week with lab work.  Shoulder blade pain Patient with likely strain of supraspinatus muscle versus spasm in this muscle. Otherwise benign exam. There is no bruising or overlying skin changes. There is no swelling in this area. Discussed Tylenol for pain. Heat and ice as well. Exercises given to the patient. He'll continue to monitor. Given return precautions.   Orders Placed This Encounter  Procedures  . HgB A1c    Standing Status:   Future    Standing Expiration Date:   06/03/2017  . Comp Met (CMET)    Standing Status:   Future    Standing Expiration Date:   06/03/2017  . Lipid Profile  Standing Status:   Future    Standing Expiration Date:   06/03/2017    Tommi Rumps, MD [place]

## 2016-06-03 NOTE — Assessment & Plan Note (Signed)
Blood pressure remained stable. Continue current medications. CMP next week with lab work.

## 2016-06-03 NOTE — Assessment & Plan Note (Signed)
Patient with likely strain of supraspinatus muscle versus spasm in this muscle. Otherwise benign exam. There is no bruising or overlying skin changes. There is no swelling in this area. Discussed Tylenol for pain. Heat and ice as well. Exercises given to the patient. He'll continue to monitor. Given return precautions.

## 2016-06-03 NOTE — Assessment & Plan Note (Signed)
Continue to follow with nephrology

## 2016-06-03 NOTE — Progress Notes (Signed)
Pre visit review using our clinic review tool, if applicable. No additional management support is needed unless otherwise documented below in the visit note. 

## 2016-06-03 NOTE — Assessment & Plan Note (Signed)
Tolerating Lipitor and Zetia. Plan to check lipid panel fasting lab work in a week or so.

## 2016-06-11 ENCOUNTER — Other Ambulatory Visit (INDEPENDENT_AMBULATORY_CARE_PROVIDER_SITE_OTHER): Payer: Medicare Other

## 2016-06-11 DIAGNOSIS — I10 Essential (primary) hypertension: Secondary | ICD-10-CM

## 2016-06-11 DIAGNOSIS — Z794 Long term (current) use of insulin: Secondary | ICD-10-CM

## 2016-06-11 DIAGNOSIS — E119 Type 2 diabetes mellitus without complications: Secondary | ICD-10-CM

## 2016-06-11 DIAGNOSIS — E785 Hyperlipidemia, unspecified: Secondary | ICD-10-CM

## 2016-06-11 LAB — COMPREHENSIVE METABOLIC PANEL
ALT: 34 U/L (ref 0–53)
AST: 19 U/L (ref 0–37)
Albumin: 3.9 g/dL (ref 3.5–5.2)
Alkaline Phosphatase: 237 U/L — ABNORMAL HIGH (ref 39–117)
BUN: 56 mg/dL — ABNORMAL HIGH (ref 6–23)
CO2: 24 mEq/L (ref 19–32)
Calcium: 9.9 mg/dL (ref 8.4–10.5)
Chloride: 107 mEq/L (ref 96–112)
Creatinine, Ser: 2.48 mg/dL — ABNORMAL HIGH (ref 0.40–1.50)
GFR: 29.51 mL/min — ABNORMAL LOW (ref >60.00)
Glucose, Bld: 119 mg/dL — ABNORMAL HIGH (ref 70–99)
Potassium: 4.7 mEq/L (ref 3.5–5.1)
Sodium: 137 mEq/L (ref 135–145)
Total Bilirubin: 0.9 mg/dL (ref 0.2–1.2)
Total Protein: 7.2 g/dL (ref 6.0–8.3)

## 2016-06-11 LAB — LIPID PANEL
Cholesterol: 129 mg/dL (ref 0–200)
HDL: 34.8 mg/dL — ABNORMAL LOW (ref >39.00)
LDL Cholesterol: 72 mg/dL (ref 0–99)
NonHDL: 93.93
Total CHOL/HDL Ratio: 4
Triglycerides: 110 mg/dL (ref 0.0–149.0)
VLDL: 22 mg/dL (ref 0.0–40.0)

## 2016-06-11 LAB — HEMOGLOBIN A1C: Hgb A1c MFr Bld: 8.3 % — ABNORMAL HIGH (ref 4.6–6.5)

## 2016-06-14 ENCOUNTER — Other Ambulatory Visit: Payer: Self-pay | Admitting: Family Medicine

## 2016-06-14 DIAGNOSIS — R748 Abnormal levels of other serum enzymes: Secondary | ICD-10-CM

## 2016-06-15 ENCOUNTER — Telehealth: Payer: Self-pay

## 2016-06-15 NOTE — Telephone Encounter (Signed)
-----   Message from Glori LuisEric G Sonnenberg, MD sent at 06/14/2016 10:02 AM EDT ----- Please let the patient know that his kidney function is relatively stable. His diabetes is not appreciably improved. I would like for him to see our pharmacist for help regarding his diabetes management. His cholesterol is well controlled. His alkaline phosphatase is elevated and has previously been elevated. I would like to obtain further lab work to determine if this is coming from his liver or from some other cause. If he is willing we can get him set up for an appointment for recheck of this. I will go ahead and place orders. Thanks.

## 2016-06-15 NOTE — Telephone Encounter (Signed)
Left message to return call 

## 2016-06-15 NOTE — Telephone Encounter (Signed)
Pt requested a call at [phone] 

## 2016-06-15 NOTE — Telephone Encounter (Signed)
Patient was informed of results.  Patient understood and no questions, comments, or concerns at this time. Patient also stated that he will schedule appointments when he is off Wednesday.

## 2016-06-15 NOTE — Telephone Encounter (Signed)
Pt requested a call at [phone]

## 2016-06-16 NOTE — Telephone Encounter (Signed)
noted 

## 2016-07-09 ENCOUNTER — Ambulatory Visit: Payer: Self-pay | Admitting: Pharmacist Clinician (PhC)/ Clinical Pharmacy Specialist

## 2016-07-09 DIAGNOSIS — Z7901 Long term (current) use of anticoagulants: Secondary | ICD-10-CM

## 2016-07-09 DIAGNOSIS — I482 Chronic atrial fibrillation, unspecified: Secondary | ICD-10-CM

## 2016-07-14 ENCOUNTER — Ambulatory Visit (INDEPENDENT_AMBULATORY_CARE_PROVIDER_SITE_OTHER): Payer: Medicare Other

## 2016-07-14 VITALS — BP 130/80 | HR 87 | Temp 97.7°F | Resp 14 | Ht 74.5 in | Wt >= 6400 oz

## 2016-07-14 DIAGNOSIS — Z0000 Encounter for general adult medical examination without abnormal findings: Secondary | ICD-10-CM

## 2016-07-14 NOTE — Progress Notes (Signed)
Subjective:   Chad Baker is a 51 y.o. male who presents for an Initial Medicare Annual Wellness Visit.  Review of Systems  No ROS.  Medicare Wellness Visit.  Cardiac Risk Factors include: diabetes mellitus;hypertension;male gender;obesity (BMI >30kg/m2)    Objective:    Today's Vitals   07/14/16 0814 07/14/16 0816  BP: 130/80   Pulse: 87   Resp: 14   Temp: 97.7 F (36.5 C)   TempSrc: Oral   SpO2: 97%   Weight: (!) 465 lb 6.4 oz (211.1 kg)   Height: 6' 2.5" (1.892 m)   PainSc:  5    Body mass index is 58.95 kg/m.  Current Medications (verified) Outpatient Encounter Prescriptions as of 07/14/2016  Medication Sig  . allopurinol (ZYLOPRIM) 300 MG tablet TAKE 1 TABLET(300 MG) BY MOUTH DAILY  . atorvastatin (LIPITOR) 80 MG tablet TAKE 1 TABLET BY MOUTH EVERY DAY  . B-D UF III MINI PEN NEEDLES 31G X 5 MM MISC USE AS DIRECTED WITH LEVEMIR  . carvedilol (COREG) 25 MG tablet TAKE 1 TABLET BY MOUTH TWICE DAILY WITH A MEAL  . Cholecalciferol 50000 units TABS Take by mouth once a week.  . Chromium-Cinnamon (CINNAMON PLUS CHROMIUM PO) Take 500 mg by mouth 2 (two) times daily. Reported on 05/15/2015  . citalopram (CELEXA) 40 MG tablet TAKE 1 TABLET BY MOUTH EVERY DAY  . clopidogrel (PLAVIX) 75 MG tablet TAKE 1 TABLET BY MOUTH DAILY WITH BREAKFAST  . Cranberry-Vitamin C-Vitamin E 4200-20-3 MG-MG-UNIT CAPS Take 2 capsules by mouth 2 (two) times daily.   Marland Kitchen ezetimibe (ZETIA) 10 MG tablet TAKE 1 TABLET BY MOUTH EVERY DAY  . furosemide (LASIX) 80 MG tablet TAKE 1 TABLET BY MOUTH DAILY  . glimepiride (AMARYL) 4 MG tablet TAKE 1 TABLET(4 MG) BY MOUTH TWICE DAILY  . glimepiride (AMARYL) 4 MG tablet TAKE 1 TABLET(4 MG) BY MOUTH TWICE DAILY  . glucose blood test strip 1 each by Other route as needed for other. Use as instructed with Freestyle monitor  . glucose monitoring kit (FREESTYLE) monitoring kit 1 each by Does not apply route as needed for other.  Marland Kitchen HUMALOG 100 UNIT/ML injection INJECT 10  UNITS UNDER THE SKIN THREE TIMES DAILY WITH MEALS  . hydrALAZINE (APRESOLINE) 100 MG tablet TAKE 1 TABLET(100 MG) BY MOUTH TWICE DAILY  . Insulin Detemir (LEVEMIR) 100 UNIT/ML Pen Inject 10 Units into the skin daily at 10 pm.  . liraglutide 18 MG/3ML SOPN Inject 0.3 mLs (1.8 mg total) into the skin daily.  . nitroGLYCERIN (NITROSTAT) 0.4 MG SL tablet Place 1 tablet (0.4 mg total) under the tongue every 5 (five) minutes as needed for chest pain.  Marland Kitchen spironolactone (ALDACTONE) 25 MG tablet Take 1 tablet (25 mg total) by mouth daily.  . Syringe/Needle, Disp, 28G X 1/2" 1 ML MISC 1 Units/hr by Does not apply route 3 (three) times daily with meals.  . warfarin (COUMADIN) 10 MG tablet Take 1-1.5 tablets by mouth daily as directed by coumadin clinic  . patiromer (VELTASSA) 8.4 g packet Take 8.4 g by mouth daily.   No facility-administered encounter medications on file as of 07/14/2016.     Allergies (verified) Codeine; Penicillins; and Lantus [insulin glargine]   History: Past Medical History:  Diagnosis Date  . Bilateral lower extremity edema - severe 04/24/2014  . CAD S/P percutaneous coronary angioplasty 04/2014   a. Inf STEMI CATH: LM nl, LAD 20, D1 nl, LCX 20p, RI nl, RCA 100% pRCA (5.32m x219m - 6.3m43m  Veriflex BMS), 80% dRCA (4.90m x269m-> 5.1 mm [name] BMS);  b. 04/2014 Echo: EF 45-50%. Basal-mid inferior akinesis with basal inferoseptal akinesis. Moderate LA dilation. RV systolic function moderately reduced with hypokinetic free wall.  . Chronic a-fib ([facility]   a. coumadin.  . CKD (chronic kidney disease), stage IV ([facility]  . Essential hypertension   . Family history of premature CAD 04/24/2014  . Hyperlipidemia with target LDL less than 70   . OSA (obstructive sleep apnea)   . Poorly controlled type 2 diabetes mellitus with circulatory disorder ([facility]  . ST elevation myocardial infarction (STEMI) of inferior wall ([facility]   [facility] Table  Limit (also with poor glycemic control  . [facility]   a. 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  . [facility]   Past Surgical History:  Procedure Laterality Date  . LEFT HEART CATHETERIZATION WITH CORONARY ANGIOGRAM N/A 04/24/2014   Procedure: LEFT HEART CATHETERIZATION WITH CORONARY ANGIOGRAM;  Surgeon: DaGlenetta HewMD, [facility]; Very Large RCA -> pRCA 100%, dRCA 80%  . PERCUTANEOUS CORONARY STENT INTERVENTION (PCI-S)  04/24/2014   Aspiration Thrombectomy --> pRCA VeriFlex BMS 5.0 x 28 (6.1 mm), dRCA (@ crux) [name] BMS 4.5 x 20 (5.1 mm)  . TRANSTHORACIC ECHOCARDIOGRAM  January 2016   EF 45-50%. Basal and mid inferior akinesis and basal inferoseptal akinesis. Moderate LA dilation, RV systolic function moderately reduced with hypokinetic free wall.   Family History  Problem Relation Age of Onset  . Heart disease Father   . Heart disease Brother    Social History   Occupational History  . Not on file.   Social History Main Topics  . Smoking status: Never Smoker  . Smokeless tobacco: Never Used  . Alcohol use No  . Drug use: No  . Sexual activity: Yes    Partners: Female     Comment: 1 partner    Tobacco Counseling Counseling given: No   Activities of Daily Living In your present state of health, do you have any difficulty performing the following activities: 07/14/2016  Hearing? N  Vision? N  Difficulty concentrating or making decisions? N  Walking or climbing stairs? Y  Dressing or bathing? N  Doing errands, shopping? N  Preparing Food and eating ? N  Using the Toilet? N  In the past six months, have you accidently leaked urine? N  Do you have problems with loss of bowel control? N  Managing your Medications? N  Managing your Finances? N  Housekeeping or managing your Housekeeping? N  Some recent data might be hidden    Immunizations and Health Maintenance Immunization History  Administered Date(s) Administered  .  Influenza Split 01/04/2014  . Influenza,inj,Quad PF,36+ Mos 12/03/2015   Health Maintenance Due  Topic Date Due  . HIV Screening  09/21/1981  . TETANUS/TDAP  09/21/1985  . URINE MICROALBUMIN  02/08/2016    Patient Care Team: ErLeone HavenMD as PCP - General (Family Medicine)  Indicate any recent Medical Services you may have received from other than [facility] providers in the past year (date may be approximate).    Assessment:   This is a routine wellness examination for [place]. The goal of the wellness visit is to assist the patient how to close the gaps in care and create a preventative care plan for the patient.   Taking calcium  VIT D as appropriate/Osteoporosis risk reviewed.  Medications reviewed; no longer taking VELTASSA 8.4g due to constipation. He plans to contact his kidney specialist to inquire of other options.  Safety issues reviewed; smoke detectors in the home. No firearms in the home.  Wears seatbelts when driving or riding with others. Patient does wear sunscreen or protective clothing when in direct sunlight. No violence in the home.  Patient is alert, normal appearance, oriented to person/place/and time. Correctly identified the president of the [place], recall of 3/3 words, and performing simple calculations.  Patient displays appropriate judgement and can read correct time from watch face.  No new identified risk were noted.  No failures at ADL's or IADL's.   BMI- discussed the importance of a healthy diet, water intake and exercise. Educational material provided.   Diet: Breakfast: Chicken biscuit  Lunch: Noodles, yogurt, fruit  Dinner: Pizza Daily fluid intake: 3 cups of caffeine, 6 cups of water  HTN- followed by PCP.  Sleep patterns- Sleeps 8 hours at night.  Wakes feeling rested. CPAP in use.  TDAP vaccine deferred per patient preference.  Follow up with insurance.  Educational material provided.  Patient Concerns: Intermittent pain in L shoulder  radiating towards the elbow.  Pain scale 5 form 0-10.  Consistently performs arm exercises previously provided but condition is worsening. OTC Tylenol 539m PRN for pain.  Deferred to PCP for follow up.    Hearing/Vision screen Hearing Screening Comments: Patient is able to hear conversational tones without difficulty.  No issues reported. Vision Screening Comments: Followed by [facility] Wears corrective lenses Last OV 05/2016 No retinopathy reported Visual acuity not assessed per patient preference since they have regular follow up with the ophthalmologist  Dietary issues and exercise activities discussed: Current Exercise Habits: The patient does not participate in regular exercise at present  Goals    . Increase physical activity          Stay active and use the exercise bike 3 times a week, 30-60 minutes      Depression Screen PHQ 2/9 Scores 07/14/2016  PHQ - 2 Score 0    Fall Risk Fall Risk  07/14/2016  Falls in the past year? No    Cognitive Function: MMSE - Mini Mental State Exam 07/14/2016  Orientation to time 5  Orientation to Place 5  Registration 3  Attention/ Calculation 5  Recall 3  Language- name 2 objects 2  Language- repeat 1  Language- follow 3 step command 3  Language- read & follow direction 1  Write a sentence 1  Copy design 1  Total score 30        Screening Tests Health Maintenance  Topic Date Due  . HIV Screening  09/21/1981  . TETANUS/TDAP  09/21/1985  . URINE MICROALBUMIN  02/08/2016  . FOOT EXAM  08/26/2016  . INFLUENZA VACCINE  11/04/2016  . HEMOGLOBIN A1C  12/12/2016  . OPHTHALMOLOGY EXAM  05/19/2017  . PNEUMOCOCCAL POLYSACCHARIDE VACCINE (2) 04/06/2018        Plan:    End of life planning; Advanced aging; Advanced directives discussed.  No HCPOA/Living Will.  Additional information provided to help them start the conversation with family.  Copy of HCPOA/Living Will requested upon completion. Time spent on this  topic is 25 minutes.  Medicare Attestation I have personally reviewed: The patient's medical and social history Their use of alcohol, tobacco or illicit drugs Their current medications and supplements The patient's functional ability including ADLs,fall risks, home safety risks,  cognitive, and hearing and visual impairment Diet and physical activities Evidence for depression   The patient's weight, height, BMI, and visual acuity have been recorded in the chart.  I have made referrals and provided education to the patient based on review of the above and I have provided the patient with a written personalized care plan for preventive services.    During the course of the visit [place] was educated and counseled about the following appropriate screening and preventive services:   Vaccines to include Pneumoccal, Influenza, Hepatitis B, Td, Zostavax, HCV       Diabetes-followed by PCP  Glaucoma screening-annual eye exam  Nutrition counseling  Smoking cessation counseling  Patient Instructions (the written plan) were given to the patient.   Varney Biles, LPN   12/03/1413

## 2016-07-14 NOTE — Patient Instructions (Addendum)
  Chad Baker , Thank you for taking time to come for your Medicare Wellness Visit. I appreciate your ongoing commitment to your health goals. Please review the following plan we discussed and let me know if I can assist you in the future.   Follow up with Dr. Birdie Sons as needed.    Bring a copy of your Health Care Power of Attorney and/or Living Will to be scanned into chart.  Have a great day!  These are the goals we discussed: Goals    . Increase physical activity          Stay active and use the exercise bike 3 times a week, 30-60 minutes       This is a list of the screening recommended for you and due dates:  Health Maintenance  Topic Date Due  . HIV Screening  09/21/1981  . Tetanus Vaccine  09/21/1985  . Urine Protein Check  02/08/2016  . Complete foot exam   08/26/2016  . Flu Shot  11/04/2016  . Hemoglobin A1C  12/12/2016  . Eye exam for diabetics  05/19/2017  . Pneumococcal vaccine (2) 04/06/2018

## 2016-07-17 ENCOUNTER — Other Ambulatory Visit: Payer: Self-pay | Admitting: Cardiology

## 2016-07-21 ENCOUNTER — Other Ambulatory Visit: Payer: Self-pay | Admitting: Family Medicine

## 2016-07-23 NOTE — Progress Notes (Addendum)
I have reviewed the above note and agree. Please make sure patient has follow-up for his arm.   Marikay Alar, M.D.

## 2016-08-11 ENCOUNTER — Telehealth: Payer: Self-pay | Admitting: Cardiology

## 2016-08-11 NOTE — Telephone Encounter (Signed)
3 attempts to schedule fu from recall list.   6 month fu per ckout 11/20/15 Deleting recall

## 2016-08-15 ENCOUNTER — Emergency Department
Admission: EM | Admit: 2016-08-15 | Discharge: 2016-08-15 | Disposition: A | Discharge status: Home / Self Care | Payer: Medicare Other | Attending: Emergency Medicine | Admitting: Emergency Medicine

## 2016-08-15 ENCOUNTER — Encounter: Payer: Self-pay | Admitting: Emergency Medicine

## 2016-08-15 DIAGNOSIS — Z79899 Other long term (current) drug therapy: Secondary | ICD-10-CM | POA: Diagnosis not present

## 2016-08-15 DIAGNOSIS — E1122 Type 2 diabetes mellitus with diabetic chronic kidney disease: Secondary | ICD-10-CM | POA: Diagnosis not present

## 2016-08-15 DIAGNOSIS — J069 Acute upper respiratory infection, unspecified: Secondary | ICD-10-CM | POA: Insufficient documentation

## 2016-08-15 DIAGNOSIS — Z794 Long term (current) use of insulin: Secondary | ICD-10-CM | POA: Diagnosis not present

## 2016-08-15 DIAGNOSIS — B9789 Other viral agents as the cause of diseases classified elsewhere: Secondary | ICD-10-CM

## 2016-08-15 DIAGNOSIS — Z7901 Long term (current) use of anticoagulants: Secondary | ICD-10-CM | POA: Diagnosis not present

## 2016-08-15 DIAGNOSIS — R05 Cough: Secondary | ICD-10-CM | POA: Diagnosis not present

## 2016-08-15 DIAGNOSIS — I129 Hypertensive chronic kidney disease with stage 1 through stage 4 chronic kidney disease, or unspecified chronic kidney disease: Secondary | ICD-10-CM | POA: Diagnosis not present

## 2016-08-15 DIAGNOSIS — N184 Chronic kidney disease, stage 4 (severe): Secondary | ICD-10-CM | POA: Insufficient documentation

## 2016-08-15 DIAGNOSIS — I2510 Atherosclerotic heart disease of native coronary artery without angina pectoris: Secondary | ICD-10-CM | POA: Insufficient documentation

## 2016-08-15 DIAGNOSIS — I252 Old myocardial infarction: Secondary | ICD-10-CM | POA: Diagnosis not present

## 2016-08-15 MED ORDER — BENZONATATE 100 MG PO CAPS: 100.0000 mg | ORAL_CAPSULE | Freq: Three times a day (TID) | ORAL | 0 refills | Status: AC | PRN

## 2016-08-15 MED ORDER — IPRATROPIUM-ALBUTEROL 0.5-2.5 (3) MG/3ML IN SOLN
3.0000 mL | Freq: Once | RESPIRATORY_TRACT | Status: AC
  Administered 2016-08-15: 3 mL via RESPIRATORY_TRACT

## 2016-08-15 MED ORDER — IPRATROPIUM-ALBUTEROL 0.5-2.5 (3) MG/3ML IN SOLN
RESPIRATORY_TRACT | Status: AC
  Administered 2016-08-15: 3 mL via RESPIRATORY_TRACT
  Filled 2016-08-15: qty 3

## 2016-08-15 MED ORDER — FLUTICASONE PROPIONATE 50 MCG/ACT NA SUSP: 2.0000 | Freq: Every day | NASAL | 0 refills | Status: AC

## 2016-08-15 MED ORDER — ALBUTEROL SULFATE HFA 108 (90 BASE) MCG/ACT IN AERS: 2.0000 | INHALATION_SPRAY | Freq: Four times a day (QID) | RESPIRATORY_TRACT | 0 refills | Status: AC | PRN

## 2016-08-15 MED ORDER — IPRATROPIUM-ALBUTEROL 0.5-2.5 (3) MG/3ML IN SOLN
3.0000 mL | Freq: Once | RESPIRATORY_TRACT | Status: AC
  Administered 2016-08-15: 3 mL via RESPIRATORY_TRACT
  Filled 2016-08-15: qty 3

## 2016-08-15 NOTE — ED Triage Notes (Signed)
Cough and runny nose x 4 days

## 2016-08-15 NOTE — ED Provider Notes (Signed)
[facility] Emergency Department Provider Note  ____________________________________________  Time seen: Approximately 9:51 AM  I have reviewed the triage vital signs and the nursing notes.   HISTORY  Chief Complaint Cough    HPI Chad Baker is a 51 y.o. male that presents to the emergency department for 4 days with congestion and cough. Patient states that he is coughing up clear and yellow sputum. The coughing makes him occasionally short of breath. He is drinking normally but does not have an appetite. He does not smoke. No history of allergies or asthma.He denies fever, sore throat, chest pain, nausea, vomiting, abdominal pain.   Past Medical History:  Diagnosis Date  . Bilateral lower extremity edema - severe 04/24/2014  . CAD S/P percutaneous coronary angioplasty 04/2014   a. Inf STEMI CATH: LM nl, LAD 20, D1 nl, LCX 20p, RI nl, RCA 100% pRCA (5.216m x264m - 6.16m616meriflex BMS), 80% dRCA (4.5mm816m0mm516m5.1 mm [name] BMS);  b. 04/2014 Echo: EF 45-50%. Basal-mid inferior akinesis with basal inferoseptal akinesis. Moderate LA dilation. RV systolic function moderately reduced with hypokinetic free wall.  . Chronic a-fib (HCC) [place]. coumadin.  . CKD (chronic kidney disease), stage IV (HCC) [place] Essential hypertension   . Family history of premature CAD 04/24/2014  . Hyperlipidemia with target LDL less than 70   . OSA (obstructive sleep apnea)   . Poorly controlled type 2 diabetes mellitus with circulatory disorder (HCC) [place] ST elevation myocardial infarction (STEMI) of inferior wall (HCC) [place]   Transferred from [facility] patient's weight > [facility] Cath Table Limit (also with poor glycemic control  . [facility] 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  . Super obesity     Patient Active Problem List   Diagnosis Date Noted  . Shoulder blade pain 06/03/2016  . Gout 12/03/2015  . Wheezing 12/03/2015  . SOB (shortness of breath) on  exertion 11/22/2015  . Absent pedal pulses 08/27/2015  . Acute URI 03/10/2015  . Medication management 02/08/2015  . Left shoulder pain 08/08/2014  . Erectile dysfunction of organic origin 06/06/2014  . Hyperlipidemia with target LDL less than 70   . Chronic kidney disease, stage IV (severe) (HCC) [place]31/2016  . Generalized maculopapular rash 05/06/2014  . Long-term (current) use of anticoagulants 04/30/2014  . CAD S/P 2 SITE BMS PCI TO PROX & DISTAL RCA -  04/24/2014  . Chronic atrial fibrillation (HCC) -- CHA2DS2Vasc Score 5, On Warfarin 04/24/2014    Class: Diagnosis of  . Diabetes mellitus, type II, insulin dependent (HCC) [place]19/2016  . Chronic venous stasis dermatitis of both lower extremities 04/24/2014  . Bilateral lower extremity edema - severe 04/24/2014  . Essential hypertension 04/24/2014    Class: Diagnosis of  . Family history of premature CAD 04/24/2014  . Morbid obesity with body mass index of 50 or higher (HCC) [place]19/2016    Class: Chronic  . ST elevation myocardial infarction (STEMI) of inferior wall, subsequent episode of care (HCC)[facility]    Past Surgical History:  Procedure Laterality Date  . LEFT HEART CATHETERIZATION WITH CORONARY ANGIOGRAM N/A 04/24/2014   Procedure: LEFT HEART CATHETERIZATION WITH CORONARY ANGIOGRAM;  Surgeon: DavidGlenetta Hew [facility]; Very Large RCA -> pRCA 100%, dRCA 80%  . PERCUTANEOUS CORONARY STENT INTERVENTION (PCI-S)  04/24/2014   Aspiration Thrombectomy --> pRCA VeriFlex BMS 5.0 x 28 (6.1 mm),  dRCA (@ crux) [name] BMS 4.5 x 20 (5.1 mm)  . TRANSTHORACIC ECHOCARDIOGRAM  January 2016   EF 45-50%. Basal and mid inferior akinesis and basal inferoseptal akinesis. Moderate LA dilation, RV systolic function moderately reduced with hypokinetic free wall.    Prior to Admission medications   Medication Sig Start Date End Date Taking? Authorizing Provider  albuterol (PROVENTIL HFA;VENTOLIN HFA) 108 (90 Base) MCG/ACT inhaler  Inhale 2 puffs into the lungs every 6 (six) hours as needed for wheezing or shortness of breath. 08/15/16   Laban Emperor, PA-C  allopurinol (ZYLOPRIM) 300 MG tablet TAKE 1 TABLET(300 MG) BY MOUTH DAILY 02/11/16   Leone Haven, MD  atorvastatin (LIPITOR) 80 MG tablet TAKE 1 TABLET BY MOUTH EVERY DAY 03/19/16   Leone Haven, MD  B-D UF III MINI PEN NEEDLES 31G X 5 MM MISC USE AS DIRECTED WITH LEVEMIR 03/27/16   Leone Haven, MD  benzonatate (TESSALON PERLES) 100 MG capsule Take 1 capsule (100 mg total) by mouth 3 (three) times daily as needed for cough. 08/15/16 08/15/17  Laban Emperor, PA-C  carvedilol (COREG) 25 MG tablet TAKE 1 TABLET BY MOUTH TWICE DAILY WITH A MEAL 07/11/15   Leonie Man, MD  Cholecalciferol 50000 units TABS Take by mouth once a week.    [provider]  Chromium-Cinnamon (CINNAMON PLUS CHROMIUM PO) Take 500 mg by mouth 2 (two) times daily. Reported on 05/15/2015    [provider]  citalopram (CELEXA) 40 MG tablet TAKE 1 TABLET BY MOUTH EVERY DAY 07/21/16   Leone Haven, MD  clopidogrel (PLAVIX) 75 MG tablet Take 1 tablet (75 mg total) by mouth daily with breakfast. Please schedule appt for refills. 07/20/16   Leonie Man, MD  Cranberry-Vitamin C-Vitamin E [phone] MG-MG-UNIT CAPS Take 2 capsules by mouth 2 (two) times daily.     [provider]  ezetimibe (ZETIA) 10 MG tablet TAKE 1 TABLET BY MOUTH EVERY DAY 02/11/16   Leone Haven, MD  fluticasone [facility]) 50 MCG/ACT nasal spray Place 2 sprays into both nostrils daily. 08/15/16 08/15/17  Laban Emperor, PA-C  furosemide (LASIX) 80 MG tablet TAKE 1 TABLET BY MOUTH DAILY 03/19/16   Leone Haven, MD  glimepiride (AMARYL) 4 MG tablet TAKE 1 TABLET(4 MG) BY MOUTH TWICE DAILY 12/05/14   Rubbie Battiest, NP  glimepiride (AMARYL) 4 MG tablet TAKE 1 TABLET(4 MG) BY MOUTH TWICE DAILY 05/19/16   Leone Haven, MD  glucose blood test strip 1 each by Other route as needed  for other. Use as instructed with Freestyle monitor    [provider]  glucose monitoring kit (FREESTYLE) monitoring kit 1 each by Does not apply route as needed for other. 04/26/14   Eulogio Bear U, DO  HUMALOG 100 UNIT/ML injection INJECT 10 UNITS UNDER THE SKIN THREE TIMES DAILY WITH MEALS 12/10/15   Leone Haven, MD  hydrALAZINE (APRESOLINE) 100 MG tablet TAKE 1 TABLET(100 MG) BY MOUTH TWICE DAILY 07/21/16   Leone Haven, MD  Insulin Detemir (LEVEMIR) 100 UNIT/ML Pen Inject 10 Units into the skin daily at 10 pm. 03/18/16   Leone Haven, MD  liraglutide 18 MG/3ML SOPN Inject 0.3 mLs (1.8 mg total) into the skin daily. 06/03/16   Leone Haven, MD  nitroGLYCERIN (NITROSTAT) 0.4 MG SL tablet Place 1 tablet (0.4 mg total) under the tongue every 5 (five) minutes as needed for chest pain. 05/29/14   Leonie Man, MD  patiromer (VELTASSA) 8.4 g packet Take 8.4 g by mouth daily.    [provider]  spironolactone (ALDACTONE) 25 MG tablet Take 1 tablet (25 mg total) by mouth daily. 03/05/16   Leone Haven, MD  Syringe/Needle, Disp, 28G X 1/2" 1 ML MISC 1 Units/hr by Does not apply route 3 (three) times daily with meals. 01/07/16   Leone Haven, MD  warfarin (COUMADIN) 10 MG tablet Take 1-1.5 tablets by mouth daily as directed by coumadin clinic 05/19/16   Leonie Man, MD    Allergies Codeine; Penicillins; and Lantus [insulin glargine]  Family History  Problem Relation Age of Onset  . Heart disease Father   . Heart disease Brother     Social History Social History  Substance Use Topics  . Smoking status: Never Smoker  . Smokeless tobacco: Never Used  . Alcohol use No     Review of Systems  Constitutional: No fever/chills Eyes: No visual changes. No discharge. ENT: Positive for congestion and rhinorrhea. Cardiovascular: No chest pain. Respiratory: Positive for cough.  Gastrointestinal: No abdominal pain.  No nausea, no vomiting.   No diarrhea.  No constipation. Musculoskeletal: Negative for musculoskeletal pain. Skin: Negative for rash, abrasions, lacerations, ecchymosis. Neurological: Negative for headaches.   ____________________________________________   PHYSICAL EXAM:  VITAL SIGNS: ED Triage Vitals  Enc Vitals Group     BP 08/15/16 0918 (!) 151/83     Pulse Rate 08/15/16 0918 (!) 6     Resp 08/15/16 0918 18     Temp 08/15/16 0918 98.1 F (36.7 C)     Temp Source 08/15/16 0918 Oral     SpO2 08/15/16 0918 94 %     Weight 08/15/16 0921 (!) 460 lb (208.7 kg)     Height 08/15/16 0921 6' 3"  (1.905 m)     Head Circumference --      Peak Flow --      Pain Score 08/15/16 0917 4     Pain Loc --      Pain Edu? --      Excl. in [place]? --      Constitutional: Alert and oriented. Well appearing and in no acute distress. Eyes: Conjunctivae are normal. PERRL. EOMI. No discharge. Head: Atraumatic. ENT: No frontal and maxillary sinus tenderness.      Ears: Tympanic membranes pearly gray with good landmarks. No discharge.      Nose: Mild congestion/rhinnorhea.      Mouth/Throat: Mucous membranes are moist. Oropharynx non-erythematous. Tonsils not enlarged. No exudates. Uvula midline. Neck: No stridor.   Hematological/Lymphatic/Immunilogical: No cervical lymphadenopathy. Cardiovascular: Normal rate.  Good peripheral circulation. Respiratory: Normal respiratory effort without tachypnea or retractions. Scattered wheezes. Good air entry to the bases with no decreased or absent breath sounds. Gastrointestinal: Bowel sounds 4 quadrants. Soft and nontender to palpation. No guarding or rigidity. No palpable masses. No distention. Musculoskeletal: Full range of motion to all extremities. No gross deformities appreciated. Neurologic:  Normal speech and language. No gross focal neurologic deficits are appreciated.  Skin:  Skin is warm, dry and intact. No rash noted.   ____________________________________________    LABS (all labs ordered are listed, but only abnormal results are displayed)  Labs Reviewed - No data to display ____________________________________________  EKG   ____________________________________________  RADIOLOGY  No results found.  ____________________________________________    PROCEDURES  Procedure(s) performed:    Procedures    Medications  ipratropium-albuterol (DUONEB) 0.5-2.5 (3) MG/3ML nebulizer solution 3 mL (3 mLs Nebulization Given 08/15/16  1000)  ipratropium-albuterol (DUONEB) 0.5-2.5 (3) MG/3ML nebulizer solution 3 mL (3 mLs Nebulization Given 08/15/16 1035)     ____________________________________________   INITIAL IMPRESSION / ASSESSMENT AND PLAN / ED COURSE  Pertinent labs & imaging results that were available during my care of the patient were reviewed by me and considered in my medical decision making (see chart for details).  Review of the [place] CSRS was performed in accordance of the [place] prior to dispensing any controlled drugs.   Patient's diagnosis is consistent with upper respiratory infection. Vital signs and exam are reassuring. Patient afebrile. This is likely viral. Patient had wheezes on auscultation so he was given a DuoNeb. Symptoms improved after first duoneb but wheezing did not completely resolve so second duoneb was given. Wheezing cleared and patient stated that he felt better after treatment. Patient appears well and is staying well hydrated.  Patient feels comfortable going home. Patient will be discharged home with prescriptions for albuterol inhaler, Tessalon Perles, Flonase. Patient is to follow up with PCP as needed or otherwise directed. Patient is given ED precautions to return to the ED for any worsening or new symptoms.     ____________________________________________  FINAL CLINICAL IMPRESSION(S) / ED DIAGNOSES  Final diagnoses:  Viral URI with cough      NEW MEDICATIONS STARTED DURING THIS  VISIT:  Discharge Medication List as of 08/15/2016 11:08 AM    START taking these medications   Details  albuterol (PROVENTIL HFA;VENTOLIN HFA) 108 (90 Base) MCG/ACT inhaler Inhale 2 puffs into the lungs every 6 (six) hours as needed for wheezing or shortness of breath., Starting Sat 08/15/2016, Print    benzonatate (TESSALON PERLES) 100 MG capsule Take 1 capsule (100 mg total) by mouth 3 (three) times daily as needed for cough., Starting Sat 08/15/2016, Until Sun 08/15/2017, Print    fluticasone (FLONASE) 50 MCG/ACT nasal spray Place 2 sprays into both nostrils daily., Starting Sat 08/15/2016, Until Sun 08/15/2017, Print            This chart was dictated using voice recognition software/Dragon. Despite best efforts to proofread, errors can occur which can change the meaning. Any change was purely unintentional.    Laban Emperor, PA-C 08/15/16 1215    Delman Kitten, MD 08/15/16 1322

## 2016-08-18 ENCOUNTER — Other Ambulatory Visit: Payer: Self-pay | Admitting: Family Medicine

## 2016-08-20 ENCOUNTER — Encounter: Payer: Self-pay | Admitting: *Deleted

## 2016-08-20 ENCOUNTER — Emergency Department: Payer: Medicare Other

## 2016-08-20 ENCOUNTER — Inpatient Hospital Stay
Admission: EM | Admit: 2016-08-20 | Discharge: 2016-08-21 | DRG: 291 | Disposition: A | Discharge status: Home / Self Care | Payer: Medicare Other | Attending: Internal Medicine | Admitting: Internal Medicine

## 2016-08-20 DIAGNOSIS — I252 Old myocardial infarction: Secondary | ICD-10-CM | POA: Diagnosis not present

## 2016-08-20 DIAGNOSIS — N184 Chronic kidney disease, stage 4 (severe): Secondary | ICD-10-CM | POA: Diagnosis not present

## 2016-08-20 DIAGNOSIS — E119 Type 2 diabetes mellitus without complications: Secondary | ICD-10-CM | POA: Diagnosis not present

## 2016-08-20 DIAGNOSIS — Z8673 Personal history of transient ischemic attack (TIA), and cerebral infarction without residual deficits: Secondary | ICD-10-CM

## 2016-08-20 DIAGNOSIS — I2510 Atherosclerotic heart disease of native coronary artery without angina pectoris: Secondary | ICD-10-CM | POA: Diagnosis present

## 2016-08-20 DIAGNOSIS — I4891 Unspecified atrial fibrillation: Secondary | ICD-10-CM | POA: Diagnosis not present

## 2016-08-20 DIAGNOSIS — M109 Gout, unspecified: Secondary | ICD-10-CM | POA: Diagnosis present

## 2016-08-20 DIAGNOSIS — E1122 Type 2 diabetes mellitus with diabetic chronic kidney disease: Secondary | ICD-10-CM | POA: Diagnosis present

## 2016-08-20 DIAGNOSIS — Z888 Allergy status to other drugs, medicaments and biological substances status: Secondary | ICD-10-CM | POA: Diagnosis not present

## 2016-08-20 DIAGNOSIS — E785 Hyperlipidemia, unspecified: Secondary | ICD-10-CM | POA: Diagnosis present

## 2016-08-20 DIAGNOSIS — J069 Acute upper respiratory infection, unspecified: Secondary | ICD-10-CM | POA: Diagnosis present

## 2016-08-20 DIAGNOSIS — I509 Heart failure, unspecified: Secondary | ICD-10-CM | POA: Diagnosis not present

## 2016-08-20 DIAGNOSIS — Z955 Presence of coronary angioplasty implant and graft: Secondary | ICD-10-CM | POA: Diagnosis not present

## 2016-08-20 DIAGNOSIS — R0602 Shortness of breath: Secondary | ICD-10-CM | POA: Diagnosis not present

## 2016-08-20 DIAGNOSIS — I10 Essential (primary) hypertension: Secondary | ICD-10-CM | POA: Diagnosis not present

## 2016-08-20 DIAGNOSIS — Z880 Allergy status to penicillin: Secondary | ICD-10-CM | POA: Diagnosis not present

## 2016-08-20 DIAGNOSIS — I5023 Acute on chronic systolic (congestive) heart failure: Secondary | ICD-10-CM | POA: Diagnosis not present

## 2016-08-20 DIAGNOSIS — Z7901 Long term (current) use of anticoagulants: Secondary | ICD-10-CM | POA: Diagnosis not present

## 2016-08-20 DIAGNOSIS — I482 Chronic atrial fibrillation: Secondary | ICD-10-CM | POA: Diagnosis present

## 2016-08-20 DIAGNOSIS — I130 Hypertensive heart and chronic kidney disease with heart failure and stage 1 through stage 4 chronic kidney disease, or unspecified chronic kidney disease: Secondary | ICD-10-CM | POA: Diagnosis not present

## 2016-08-20 DIAGNOSIS — E1165 Type 2 diabetes mellitus with hyperglycemia: Secondary | ICD-10-CM | POA: Diagnosis not present

## 2016-08-20 DIAGNOSIS — R7989 Other specified abnormal findings of blood chemistry: Secondary | ICD-10-CM | POA: Diagnosis not present

## 2016-08-20 DIAGNOSIS — G4733 Obstructive sleep apnea (adult) (pediatric): Secondary | ICD-10-CM | POA: Diagnosis present

## 2016-08-20 DIAGNOSIS — R778 Other specified abnormalities of plasma proteins: Secondary | ICD-10-CM

## 2016-08-20 LAB — CBC WITH DIFFERENTIAL/PLATELET
Basophils Absolute: 0.1 10*3/uL (ref 0–0.1)
Basophils Relative: 1 %
Eosinophils Absolute: 0.1 10*3/uL (ref 0–0.7)
Eosinophils Relative: 1 %
HCT: 42.8 % (ref 40.0–52.0)
Hemoglobin: 14.1 g/dL (ref 13.0–18.0)
Lymphocytes Relative: 11 %
Lymphs Abs: 0.8 10*3/uL — ABNORMAL LOW (ref 1.0–3.6)
MCH: 31.5 pg (ref 26.0–34.0)
MCHC: 32.8 g/dL (ref 32.0–36.0)
MCV: 95.8 fL (ref 80.0–100.0)
Monocytes Absolute: 0.5 10*3/uL (ref 0.2–1.0)
Monocytes Relative: 7 %
Neutro Abs: 6.1 10*3/uL (ref 1.4–6.5)
Neutrophils Relative %: 80 %
Platelets: 206 10*3/uL (ref 150–440)
RBC: 4.47 MIL/uL (ref 4.40–5.90)
RDW: 16.7 % — ABNORMAL HIGH (ref 11.5–14.5)
WBC: 7.6 10*3/uL (ref 3.8–10.6)

## 2016-08-20 LAB — BASIC METABOLIC PANEL
Anion gap: 7 (ref 5–15)
BUN: 66 mg/dL — ABNORMAL HIGH (ref 6–20)
CO2: 23 mmol/L (ref 22–32)
Calcium: 9.3 mg/dL (ref 8.9–10.3)
Chloride: 106 mmol/L (ref 101–111)
Creatinine, Ser: 2.69 mg/dL — ABNORMAL HIGH (ref 0.61–1.24)
GFR calc Af Amer: 30 mL/min — ABNORMAL LOW (ref >60)
GFR calc non Af Amer: 26 mL/min — ABNORMAL LOW (ref >60)
Glucose, Bld: 157 mg/dL — ABNORMAL HIGH (ref 65–99)
Potassium: 5.3 mmol/L — ABNORMAL HIGH (ref 3.5–5.1)
Sodium: 136 mmol/L (ref 135–145)

## 2016-08-20 LAB — BRAIN NATRIURETIC PEPTIDE: B Natriuretic Peptide: 389 pg/mL — ABNORMAL HIGH (ref 0.0–100.0)

## 2016-08-20 LAB — GLUCOSE, CAPILLARY: Glucose-Capillary: 135 mg/dL — ABNORMAL HIGH (ref 65–99)

## 2016-08-20 LAB — PROTIME-INR
INR: 1.18
Prothrombin Time: 15.1 seconds (ref 11.4–15.2)

## 2016-08-20 LAB — TROPONIN I
Troponin I: 0.09 ng/mL (ref <0.03)
Troponin I: 0.09 ng/mL (ref <0.03)
Troponin I: 0.08 ng/mL (ref <0.03)

## 2016-08-20 MED ORDER — FLUTICASONE PROPIONATE 50 MCG/ACT NA SUSP
2.0000 | Freq: Every day | NASAL | Status: DC
  Administered 2016-08-21: 2 via NASAL
  Filled 2016-08-20: qty 16

## 2016-08-20 MED ORDER — ATORVASTATIN CALCIUM 20 MG PO TABS
80.0000 mg | ORAL_TABLET | Freq: Every day | ORAL | Status: DC
Start: 2016-08-20 — End: 2016-08-21
  Administered 2016-08-20 – 2016-08-21 (×2): 80 mg via ORAL
  Filled 2016-08-20 (×2): qty 4

## 2016-08-20 MED ORDER — CLOPIDOGREL BISULFATE 75 MG PO TABS
75.0000 mg | ORAL_TABLET | Freq: Every day | ORAL | Status: DC
  Administered 2016-08-21: 75 mg via ORAL
  Filled 2016-08-20 (×2): qty 1

## 2016-08-20 MED ORDER — HYDRALAZINE HCL 50 MG PO TABS
ORAL_TABLET | ORAL | Status: AC
Start: 2016-08-20 — End: 2016-08-20
  Administered 2016-08-20: 50 mg via ORAL
  Filled 2016-08-20: qty 1

## 2016-08-20 MED ORDER — METOPROLOL TARTRATE 5 MG/5ML IV SOLN
5.0000 mg | Freq: Four times a day (QID) | INTRAVENOUS | Status: DC | PRN
  Administered 2016-08-21: 5 mg via INTRAVENOUS
  Filled 2016-08-20 (×2): qty 5

## 2016-08-20 MED ORDER — INSULIN ASPART 100 UNIT/ML ~~LOC~~ SOLN
10.0000 [IU] | Freq: Three times a day (TID) | SUBCUTANEOUS | Status: DC
  Administered 2016-08-21 (×2): 10 [IU] via SUBCUTANEOUS
  Filled 2016-08-20 (×2): qty 10

## 2016-08-20 MED ORDER — CITALOPRAM HYDROBROMIDE 20 MG PO TABS
40.0000 mg | ORAL_TABLET | Freq: Every day | ORAL | Status: DC
  Administered 2016-08-20 – 2016-08-21 (×2): 40 mg via ORAL
  Filled 2016-08-20 (×2): qty 2

## 2016-08-20 MED ORDER — INSULIN ASPART 100 UNIT/ML ~~LOC~~ SOLN
0.0000 [IU] | Freq: Three times a day (TID) | SUBCUTANEOUS | Status: DC
  Administered 2016-08-20 – 2016-08-21 (×3): 1 [IU] via SUBCUTANEOUS
  Filled 2016-08-20 (×3): qty 1

## 2016-08-20 MED ORDER — INSULIN DETEMIR 100 UNIT/ML ~~LOC~~ SOLN
10.0000 [IU] | Freq: Every day | SUBCUTANEOUS | Status: DC
  Administered 2016-08-20: 10 [IU] via SUBCUTANEOUS
  Filled 2016-08-20 (×2): qty 0.1

## 2016-08-20 MED ORDER — HYDRALAZINE HCL 50 MG PO TABS
50.0000 mg | ORAL_TABLET | Freq: Three times a day (TID) | ORAL | Status: DC
  Administered 2016-08-20 – 2016-08-21 (×4): 50 mg via ORAL
  Filled 2016-08-20 (×3): qty 1

## 2016-08-20 MED ORDER — EZETIMIBE 10 MG PO TABS
10.0000 mg | ORAL_TABLET | Freq: Every day | ORAL | Status: DC
  Administered 2016-08-20 – 2016-08-21 (×2): 10 mg via ORAL
  Filled 2016-08-20 (×2): qty 1

## 2016-08-20 MED ORDER — HEPARIN SODIUM (PORCINE) 5000 UNIT/ML IJ SOLN
5000.0000 [IU] | Freq: Three times a day (TID) | INTRAMUSCULAR | Status: DC
  Filled 2016-08-20 (×2): qty 1

## 2016-08-20 MED ORDER — FUROSEMIDE 10 MG/ML IJ SOLN
40.0000 mg | Freq: Two times a day (BID) | INTRAMUSCULAR | Status: DC
  Administered 2016-08-21: 40 mg via INTRAVENOUS
  Filled 2016-08-20: qty 4

## 2016-08-20 MED ORDER — FUROSEMIDE 10 MG/ML IJ SOLN
80.0000 mg | Freq: Once | INTRAMUSCULAR | Status: AC
  Administered 2016-08-20: 80 mg via INTRAVENOUS
  Filled 2016-08-20: qty 8

## 2016-08-20 MED ORDER — DOCUSATE SODIUM 100 MG PO CAPS: 100.0000 mg | ORAL_CAPSULE | Freq: Two times a day (BID) | ORAL | Status: DC | PRN

## 2016-08-20 MED ORDER — ALBUTEROL SULFATE (2.5 MG/3ML) 0.083% IN NEBU
5.0000 mg | INHALATION_SOLUTION | Freq: Once | RESPIRATORY_TRACT | Status: AC
  Administered 2016-08-20: 5 mg via RESPIRATORY_TRACT
  Filled 2016-08-20: qty 6

## 2016-08-20 MED ORDER — NITROGLYCERIN 0.4 MG SL SUBL: 0.4000 mg | SUBLINGUAL_TABLET | SUBLINGUAL | Status: DC | PRN

## 2016-08-20 MED ORDER — LIRAGLUTIDE 18 MG/3ML ~~LOC~~ SOPN: 1.8000 mg | PEN_INJECTOR | Freq: Every day | SUBCUTANEOUS | Status: DC

## 2016-08-20 MED ORDER — WARFARIN - PHARMACIST DOSING INPATIENT
Freq: Every day | Status: DC
  Filled 2016-08-20 (×7): qty 1

## 2016-08-20 MED ORDER — ALLOPURINOL 100 MG PO TABS
300.0000 mg | ORAL_TABLET | Freq: Every day | ORAL | Status: DC
  Administered 2016-08-20 – 2016-08-21 (×2): 300 mg via ORAL
  Filled 2016-08-20 (×2): qty 3

## 2016-08-20 MED ORDER — ALBUTEROL SULFATE (2.5 MG/3ML) 0.083% IN NEBU: 2.5000 mg | INHALATION_SOLUTION | Freq: Four times a day (QID) | RESPIRATORY_TRACT | Status: DC | PRN

## 2016-08-20 MED ORDER — BENZONATATE 100 MG PO CAPS: 100.0000 mg | ORAL_CAPSULE | Freq: Three times a day (TID) | ORAL | Status: DC | PRN

## 2016-08-20 MED ORDER — WARFARIN SODIUM 7.5 MG PO TABS
15.0000 mg | ORAL_TABLET | Freq: Once | ORAL | Status: AC
  Administered 2016-08-20: 15 mg via ORAL
  Filled 2016-08-20: qty 2

## 2016-08-20 MED ORDER — SPIRONOLACTONE 25 MG PO TABS
25.0000 mg | ORAL_TABLET | Freq: Every day | ORAL | Status: DC
  Administered 2016-08-20 – 2016-08-21 (×2): 25 mg via ORAL
  Filled 2016-08-20 (×2): qty 1

## 2016-08-20 NOTE — Progress Notes (Signed)
ANTICOAGULATION CONSULT NOTE - Initial Consult  Pharmacy Consult for  Warfarin dosing  Indication: atrial fibrillation  Allergies  Allergen Reactions  . Codeine Nausea And Vomiting  . Penicillins     Has patient had a PCN reaction causing immediate rash, facial/tongue/throat swelling, SOB or lightheadedness with hypotension: Unknown Has patient had a PCN reaction causing severe rash involving mucus membranes or skin necrosis: Unknown Has patient had a PCN reaction that required hospitalization: Unknown Has patient had a PCN reaction occurring within the last 10 years: Unknown If all of the above answers are "NO", then may proceed with Cephalosporin use.   . Lantus [Insulin Glargine] Itching and Rash    Patient Measurements: Height: 6\' 3"  (190.5 cm) Weight: (!) 460 lb (208.7 kg) IBW/kg (Calculated) : 84.5  Vital Signs: Temp: 97 F (36.1 C) (05/17 1343) Temp Source: Oral (05/17 1343) BP: 155/115 (05/17 1538) Pulse Rate: 46 (05/17 1539)  Labs:  Recent Labs  08/20/16 1518  HGB 14.1  HCT 42.8  PLT 206  LABPROT 15.1  INR 1.18  CREATININE 2.69*  TROPONINI 0.09*    Estimated Creatinine Clearance: 63.1 mL/min (A) (by C-G formula based on SCr of 2.69 mg/dL (H)).   Medical History: Past Medical History:  Diagnosis Date  . Bilateral lower extremity edema - severe 04/24/2014  . CAD S/P percutaneous coronary angioplasty 04/2014   a. Inf STEMI CATH: LM nl, LAD 20, D1 nl, LCX 20p, RI nl, RCA 100% pRCA (5.190mm x1328mm  - 6.741mm Veriflex BMS), 80% dRCA (4.285mm x1820mm -> 5.1 mm [name] BMS);  b. 04/2014 Echo: EF 45-50%. Basal-mid inferior akinesis with basal inferoseptal akinesis. Moderate LA dilation. RV systolic function moderately reduced with hypokinetic free wall.  . Chronic a-fib (HCC)    a. coumadin.  . CKD (chronic kidney disease), stage IV (HCC)   . Essential hypertension   . Family history of premature CAD 04/24/2014  . Hyperlipidemia with target LDL less than 70   . OSA  (obstructive sleep apnea)   . Poorly controlled type 2 diabetes mellitus with circulatory disorder (HCC)   . ST elevation myocardial infarction (STEMI) of inferior wall (HCC) 04/2014   [facility] Cath Table Limit (also with poor glycemic control  . Stroke [facility](HCC)    a. 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  . Super obesity     Assessment: 51 yo male with PMH of A. Fib, admitted with Acute on chronic CHF. Pharmacy consulted for warfarin dosing and monitoring. INR on admission subtherapeutic at 1.18  Home Regimen:  Ulanda Edisonues, Thurs, Sat: 15mg                               Sun, Mon, Wed, Fri: 10mg   DATE  INR DOSE 5/17  1.18 15mg   Goal of Therapy:  INR 2-3 Monitor platelets by anticoagulation protocol: Yes   Plan:  Will order warfarin 15mg  x 1 dose and recheck INR with am labs.   Gardner CandleSheema M Alailah Safley, PharmD, BCPS Clinical Pharmacist 08/20/2016 5:14 PM

## 2016-08-20 NOTE — ED Triage Notes (Signed)
Pt reports having been seen in ED Sat. For similar SOB. Treated with breathing treatments and discharged home with medications. Pt reports SO has not changed and congested cough is no longer producing phlem. Pt in NAD and able to talk in complete sentences.

## 2016-08-20 NOTE — ED Provider Notes (Signed)
[facility] Emergency Department Provider Note       Time seen: ----------------------------------------- 2:51 PM on 08/20/2016 -----------------------------------------     I have reviewed the triage vital signs and the nursing notes.   HISTORY   Chief Complaint Shortness of Breath    HPI Chad Baker is a 51 y.o. male who presents to the ED for shortness of breath. Patient was recently seen and diagnosed with a viral upper respiratory infection. He reports taking an inhaler and cough medication without significant improvement. He denies fevers, chills, chest pain but has persistent shortness of breath. Nothing makes his symptoms better. He has been short of breath for around a week.   Past Medical History:  Diagnosis Date  . Bilateral lower extremity edema - severe 04/24/2014  . CAD S/P percutaneous coronary angioplasty 04/2014   a. Inf STEMI CATH: LM nl, LAD 20, D1 nl, LCX 20p, RI nl, RCA 100% pRCA (5.45mm x40mm  - 6.74mm Veriflex BMS), 80% dRCA (4.92mm x82mm -> 5.1 mm [name] BMS);  b. 04/2014 Echo: EF 45-50%. Basal-mid inferior akinesis with basal inferoseptal akinesis. Moderate LA dilation. RV systolic function moderately reduced with hypokinetic free wall.  . Chronic a-fib (HCC)    a. coumadin.  . CKD (chronic kidney disease), stage IV (HCC)   . Essential hypertension   . Family history of premature CAD 04/24/2014  . Hyperlipidemia with target LDL less than 70   . OSA (obstructive sleep apnea)   . Poorly controlled type 2 diabetes mellitus with circulatory disorder (HCC)   . ST elevation myocardial infarction (STEMI) of inferior wall (HCC) 04/2014   [facility] due to patient's weight > [facility] Cath Table Limit (also with poor glycemic control  . Stroke [facility])    a. 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  . Super obesity     Patient Active Problem List   Diagnosis Date Noted  . Shoulder blade pain 06/03/2016  . Gout 12/03/2015   . Wheezing 12/03/2015  . SOB (shortness of breath) on exertion 11/22/2015  . Absent pedal pulses 08/27/2015  . Acute URI 03/10/2015  . Medication management 02/08/2015  . Left shoulder pain 08/08/2014  . Erectile dysfunction of organic origin 06/06/2014  . Hyperlipidemia with target LDL less than 70   . Chronic kidney disease, stage IV (severe) (HCC) 05/06/2014  . Generalized maculopapular rash 05/06/2014  . Long-term (current) use of anticoagulants 04/30/2014  . CAD S/P 2 SITE BMS PCI TO PROX & DISTAL RCA -  04/24/2014  . Chronic atrial fibrillation (HCC) -- CHA2DS2Vasc Score 5, On Warfarin 04/24/2014    Class: Diagnosis of  . Diabetes mellitus, type II, insulin dependent (HCC) 04/24/2014  . Chronic venous stasis dermatitis of both lower extremities 04/24/2014  . Bilateral lower extremity edema - severe 04/24/2014  . Essential hypertension 04/24/2014    Class: Diagnosis of  . Family history of premature CAD 04/24/2014  . Morbid obesity with body mass index of 50 or higher (HCC) 04/24/2014    Class: Chronic  . ST elevation myocardial infarction (STEMI) of inferior wall, subsequent episode of care [facility]) 04/24/2014    Past Surgical History:  Procedure Laterality Date  . LEFT HEART CATHETERIZATION WITH CORONARY ANGIOGRAM N/A 04/24/2014   Procedure: LEFT HEART CATHETERIZATION WITH CORONARY ANGIOGRAM;  Surgeon: Bryan Lemma, MD, Location: [facility] CATH LAB; Very Large RCA -> pRCA 100%, dRCA 80%  . PERCUTANEOUS CORONARY STENT INTERVENTION (PCI-S)  04/24/2014   Aspiration Thrombectomy -->  pRCA VeriFlex BMS 5.0 x 28 (6.1 mm), dRCA (@ crux) [name] BMS 4.5 x 20 (5.1 mm)  . TRANSTHORACIC ECHOCARDIOGRAM  January 2016   EF 45-50%. Basal and mid inferior akinesis and basal inferoseptal akinesis. Moderate LA dilation, RV systolic function moderately reduced with hypokinetic free wall.    Allergies Codeine; Penicillins; and Lantus [insulin glargine]  Social History Social History  Substance Use  Topics  . Smoking status: Never Smoker  . Smokeless tobacco: Never Used  . Alcohol use No    Review of Systems Constitutional: Negative for fever. Eyes: Negative for vision changes ENT:  Negative for congestion, sore throat Cardiovascular: Negative for chest pain. Respiratory: Positive shortness of breath Gastrointestinal: Negative for abdominal pain, vomiting and diarrhea. Genitourinary: Negative for dysuria. Musculoskeletal: Negative for back pain. Positive for chronic edema Skin: Negative for rash. Neurological: Negative for headaches, focal weakness or numbness.  All systems negative/normal/unremarkable except as stated in the HPI  ____________________________________________   PHYSICAL EXAM:  VITAL SIGNS: ED Triage Vitals  Enc Vitals Group     BP --      Pulse Rate 08/20/16 1343 (!) 108     Resp 08/20/16 1343 (!) 22     Temp 08/20/16 1343 97 F (36.1 C)     Temp Source 08/20/16 1343 Oral     SpO2 08/20/16 1343 98 %     Weight 08/20/16 1344 (!) 460 lb (208.7 kg)     Height 08/20/16 1344 6\' 3"  (1.905 m)     Head Circumference --      Peak Flow --      Pain Score --      Pain Loc --      Pain Edu? --      Excl. in [place]? --     Constitutional: Alert and oriented. Mild distress Eyes: Conjunctivae are normal. Normal extraocular movements. ENT   Head: Normocephalic and atraumatic.   Nose: No congestion/rhinnorhea.   Mouth/Throat: Mucous membranes are moist.   Neck: No stridor. Cardiovascular: Normal rate, regular rhythm. No murmurs, rubs, or gallops. Respiratory: Normal respiratory effort with bilateral rales Gastrointestinal: Soft and nontender. Normal bowel sounds Musculoskeletal: Nontender with normal range of motion in extremities. Chronic bilateral edema is noted Neurologic:  Normal speech and language. No gross focal neurologic deficits are appreciated.  Skin:  Skin is warm, dry and intact. No rash noted. Psychiatric: Mood and affect are normal.  Speech and behavior are normal.  ____________________________________________  EKG: Interpreted by me. Atrial fibrillation with a rapid ventricular response, rate is 102 bpm, wide QRS, normal QT  ____________________________________________  ED COURSE:  Pertinent labs & imaging results that were available during my care of the patient were reviewed by me and considered in my medical decision making (see chart for details). Patient presents for shortness of breath, we will assess with labs and imaging as indicated. Patient likely with congestive heart failure. We will assess with labs and imaging.   Procedures ____________________________________________   LABS (pertinent positives/negatives)  Labs Reviewed  CBC WITH DIFFERENTIAL/PLATELET - Abnormal; Notable for the following:       Result Value   RDW 16.7 (*)    Lymphs Abs 0.8 (*)    All other components within normal limits  BASIC METABOLIC PANEL - Abnormal; Notable for the following:    Potassium 5.3 (*)    Glucose, Bld 157 (*)    BUN 66 (*)    Creatinine, Ser 2.69 (*)    GFR calc non Af [place]  26 (*)    GFR calc Af Amer 30 (*)    All other components within normal limits  TROPONIN I - Abnormal; Notable for the following:    Troponin I 0.09 (*)    All other components within normal limits  PROTIME-INR  BRAIN NATRIURETIC PEPTIDE    RADIOLOGY Images were viewed by me CXR IMPRESSION: Cardiac enlargement and vascular congestion with possible mild interstitial edema. No pleural effusions.  ____________________________________________  FINAL ASSESSMENT AND PLAN  Congestive heart failure, Elevated troponin  Plan: Patient's labs and imaging were dictated above. Patient had presented for persistent shortness of breath and cough. Initially this was thought to be a viral etiology but he does have evidence of congestive heart failure. He was given additional IV Lasix, he would benefit from admission and  diuresis.   Emily FilbertWilliams, Jonathan E, MD   Note: This note was generated in part or whole with voice recognition software. Voice recognition is usually quite accurate but there are transcription errors that can and very often do occur. I apologize for any typographical errors that were not detected and corrected.     Emily FilbertWilliams, Jonathan E, MD 08/20/16 [phone]

## 2016-08-20 NOTE — ED Notes (Signed)
NAD noted at this time. Pt noted to be sitting up on side of bed with family at bedside. Pt denies any needs, states he is feeling a little bit better. Will continue to monitor for further patient needs.

## 2016-08-20 NOTE — ED Notes (Signed)
This RN to bedside, pt is noted to be sitting up on side of bed at this time, respirations even and unlabored, pt visualized in NAD. This RN apologized for delay, pt states understanding. Pt denies any needs at this time. Will continue to monitor for further patient needs.

## 2016-08-20 NOTE — H&P (Signed)
[place] [facility] [place] NAME: Chad Baker    MR#:  [id number]  DATE OF BIRTH:  [birth year]  DATE OF ADMISSION:  08/20/2016  PRIMARY CARE PHYSICIAN: Leone Haven, MD   REQUESTING/REFERRING PHYSICIAN: Gwyndolyn Saxon  CHIEF COMPLAINT:   Chief Complaint  Patient presents with  . Shortness of Breath    HISTORY OF PRESENT ILLNESS: Chad Baker  is a 51 y.o. male with a known history of CAD, CHF, EF 45%, A fib, DM, HTn,  Morbid obesity, hyperlipidmeia- for last 4-5 days have SOB- denies associated orthopnea, cough, fever, sputum. He have chronic edematous legs. Came to ER 4 days ago, given inhalers, did not help. Today on Xray showed Pulm edema, so given as CHF.  PAST MEDICAL HISTORY:   Past Medical History:  Diagnosis Date  . Bilateral lower extremity edema - severe 04/24/2014  . CAD S/P percutaneous coronary angioplasty 04/2014   a. Inf STEMI CATH: LM nl, LAD 20, D1 nl, LCX 20p, RI nl, RCA 100% pRCA (5.53m x242m - 6.60m83meriflex BMS), 80% dRCA (4.5mm18m0mm80m5.1 mm [name] BMS);  b. 04/2014 Echo: EF 45-50%. Basal-mid inferior akinesis with basal inferoseptal akinesis. Moderate LA dilation. RV systolic function moderately reduced with hypokinetic free wall.  . Chronic a-fib (HCC) [place]. coumadin.  . CKD (chronic kidney disease), stage IV (HCC) [place] Essential hypertension   . Family history of premature CAD 04/24/2014  . Hyperlipidemia with target LDL less than 70   . OSA (obstructive sleep apnea)   . Poorly controlled type 2 diabetes mellitus with circulatory disorder (HCC) [place] [facility] elevation myocardial infarction (STEMI) of inferior wall (HCC) [id number]   [facility] Table Limit (also with poor glycemic control  . [facility] 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  . Super obesity     PAST SURGICAL HISTORY: Past Surgical History:  Procedure Laterality Date  . LEFT HEART CATHETERIZATION WITH  CORONARY ANGIOGRAM N/A 04/24/2014   Procedure: LEFT HEART CATHETERIZATION WITH CORONARY ANGIOGRAM;  Surgeon: DavidGlenetta Hew [facility]; Very Large RCA -> pRCA 100%, dRCA 80%  . PERCUTANEOUS CORONARY STENT INTERVENTION (PCI-S)  04/24/2014   Aspiration Thrombectomy --> pRCA VeriFlex BMS 5.0 x 28 (6.1 mm), dRCA (@ crux) [name] BMS 4.5 x 20 (5.1 mm)  . TRANSTHORACIC ECHOCARDIOGRAM  January 2016   EF 45-50%. Basal and mid inferior akinesis and basal inferoseptal akinesis. Moderate LA dilation, RV systolic function moderately reduced with hypokinetic free wall.    SOCIAL HISTORY:  Social History  Substance Use Topics  . Smoking status: Never Smoker  . Smokeless tobacco: Never Used  . Alcohol use No    FAMILY HISTORY:  Family History  Problem Relation Age of Onset  . Heart disease Father   . Heart disease Brother     DRUG ALLERGIES:  Allergies  Allergen Reactions  . Codeine Nausea And Vomiting  . Penicillins     Has patient had a PCN reaction causing immediate rash, facial/tongue/throat swelling, SOB or lightheadedness with hypotension: Unknown Has patient had a PCN reaction causing severe rash involving mucus membranes or skin necrosis: Unknown Has patient had a PCN reaction that required hospitalization: Unknown Has patient had a PCN reaction occurring within the last 10 years: Unknown If all of the above answers are "NO", then may proceed with  Cephalosporin use.   . Lantus [Insulin Glargine] Itching and Rash    REVIEW OF SYSTEMS:   CONSTITUTIONAL: No fever, fatigue or weakness.  EYES: No blurred or double vision.  EARS, NOSE, AND THROAT: No tinnitus or ear pain.  RESPIRATORY: positive for cough, shortness of breath,no wheezing or hemoptysis.  CARDIOVASCULAR: No chest pain, orthopnea, edema.  GASTROINTESTINAL: No nausea, vomiting, diarrhea or abdominal pain.  GENITOURINARY: No dysuria, hematuria.  ENDOCRINE: No polyuria, nocturia,  HEMATOLOGY: No anemia, easy  bruising or bleeding SKIN: No rash or lesion. MUSCULOSKELETAL: No joint pain or arthritis.   NEUROLOGIC: No tingling, numbness, weakness.  PSYCHIATRY: No anxiety or depression.   MEDICATIONS AT HOME:  Prior to Admission medications   Medication Sig Start Date End Date Taking? Authorizing Provider  albuterol (PROVENTIL HFA;VENTOLIN HFA) 108 (90 Base) MCG/ACT inhaler Inhale 2 puffs into the lungs every 6 (six) hours as needed for wheezing or shortness of breath. 08/15/16  Yes Laban Emperor, PA-C  allopurinol (ZYLOPRIM) 300 MG tablet TAKE 1 TABLET(300 MG) BY MOUTH DAILY 02/11/16  Yes Leone Haven, MD  atorvastatin (LIPITOR) 80 MG tablet TAKE 1 TABLET BY MOUTH EVERY DAY 03/19/16  Yes Leone Haven, MD  Cholecalciferol 50000 units TABS Take by mouth once a week.   Yes [provider]  Chromium-Cinnamon (CINNAMON PLUS CHROMIUM PO) Take 500 mg by mouth 2 (two) times daily. Reported on 05/15/2015   Yes [provider]  citalopram (CELEXA) 40 MG tablet TAKE 1 TABLET BY MOUTH EVERY DAY 07/21/16  Yes Leone Haven, MD  clopidogrel (PLAVIX) 75 MG tablet Take 1 tablet (75 mg total) by mouth daily with breakfast. Please schedule appt for refills. 07/20/16  Yes Leonie Man, MD  Cranberry-Vitamin C-Vitamin E [phone] MG-MG-UNIT CAPS Take 2 capsules by mouth 2 (two) times daily.    Yes [provider]  ezetimibe (ZETIA) 10 MG tablet TAKE 1 TABLET BY MOUTH EVERY DAY 02/11/16  Yes Leone Haven, MD  fluticasone [facility]) 50 MCG/ACT nasal spray Place 2 sprays into both nostrils daily. 08/15/16 08/15/17 Yes Laban Emperor, PA-C  furosemide (LASIX) 80 MG tablet TAKE 1 TABLET BY MOUTH DAILY 03/19/16  Yes Leone Haven, MD  glimepiride (AMARYL) 4 MG tablet TAKE 1 TABLET(4 MG) BY MOUTH TWICE DAILY 12/05/14  Yes Doss, Velora Heckler, NP  HUMALOG 100 UNIT/ML injection INJECT 10 UNITS UNDER THE SKIN THREE TIMES DAILY WITH MEALS 12/10/15  Yes Leone Haven, MD  hydrALAZINE  (APRESOLINE) 100 MG tablet TAKE 1 TABLET(100 MG) BY MOUTH TWICE DAILY 07/21/16  Yes Leone Haven, MD  Insulin Detemir (LEVEMIR) 100 UNIT/ML Pen Inject 10 Units into the skin daily at 10 pm. 03/18/16  Yes Leone Haven, MD  liraglutide 18 MG/3ML SOPN Inject 0.3 mLs (1.8 mg total) into the skin daily. 06/03/16  Yes Leone Haven, MD  spironolactone (ALDACTONE) 25 MG tablet Take 1 tablet (25 mg total) by mouth daily. 03/05/16  Yes Leone Haven, MD  warfarin (COUMADIN) 10 MG tablet Take 1-1.5 tablets by mouth daily as directed by coumadin clinic 05/19/16  Yes Leonie Man, MD  B-D UF III MINI PEN NEEDLES 31G X 5 MM MISC USE AS DIRECTED WITH LEVEMIR 03/27/16   Leone Haven, MD  benzonatate (TESSALON PERLES) 100 MG capsule Take 1 capsule (100 mg total) by mouth 3 (three) times daily as needed for cough. 08/15/16 08/15/17  Laban Emperor, PA-C  carvedilol (COREG) 25 MG tablet TAKE 1 TABLET BY  MOUTH TWICE DAILY WITH A MEAL Patient not taking: Reported on 08/20/2016 07/11/15   Leonie Man, MD  glimepiride (AMARYL) 4 MG tablet TAKE 1 TABLET(4 MG) BY MOUTH TWICE DAILY Patient not taking: Reported on 08/20/2016 08/18/16   Leone Haven, MD  glucose blood test strip 1 each by Other route as needed for other. Use as instructed with Freestyle monitor    [provider]  glucose monitoring kit (FREESTYLE) monitoring kit 1 each by Does not apply route as needed for other. 04/26/14   Geradine Girt, DO  nitroGLYCERIN (NITROSTAT) 0.4 MG SL tablet Place 1 tablet (0.4 mg total) under the tongue every 5 (five) minutes as needed for chest pain. 05/29/14   Leonie Man, MD  Syringe/Needle, Disp, 28G X 1/2" 1 ML MISC 1 Units/hr by Does not apply route 3 (three) times daily with meals. 01/07/16   Leone Haven, MD      PHYSICAL EXAMINATION:   VITAL SIGNS: Blood pressure (!) 155/115, pulse (!) 46, temperature 97 F (36.1 C), temperature source Oral, resp. rate 16, height 6'  3" (1.905 m), weight (!) 208.7 kg (460 lb), SpO2 100 %.  GENERAL:  52 y.o.-year-old morbidly obese patient lying in the bed with no acute distress.  EYES: Pupils equal, round, reactive to light and accommodation. No scleral icterus. Extraocular muscles intact.  HEENT: Head atraumatic, normocephalic. Oropharynx and nasopharynx clear.  NECK:  Supple, no jugular venous distention. No thyroid enlargement, no tenderness.  LUNGS: Normal breath sounds bilaterally, no wheezing, some crepitation. No use of accessory muscles of respiration.  CARDIOVASCULAR: S1, S2 fast, irregular. No murmurs, rubs, or gallops.  ABDOMEN: Soft, nontender, nondistended. Bowel sounds present. No organomegaly or mass.  EXTREMITIES: b/l chronic pedal edema, no cyanosis, or clubbing.  NEUROLOGIC: Cranial nerves II through XII are intact. Muscle strength 5/5 in all extremities. Sensation intact. Gait not checked.  PSYCHIATRIC: The patient is alert and oriented x 3.  SKIN: No obvious rash, lesion, or ulcer.   LABORATORY PANEL:   CBC  Recent Labs Lab 08/20/16 1518  WBC 7.6  HGB 14.1  HCT 42.8  PLT 206  MCV 95.8  MCH 31.5  MCHC 32.8  RDW 16.7*  LYMPHSABS 0.8*  MONOABS 0.5  EOSABS 0.1  BASOSABS 0.1   ------------------------------------------------------------------------------------------------------------------  Chemistries   Recent Labs Lab 08/20/16 1518  NA 136  K 5.3*  CL 106  CO2 23  GLUCOSE 157*  BUN 66*  CREATININE 2.69*  CALCIUM 9.3   ------------------------------------------------------------------------------------------------------------------ estimated creatinine clearance is 63.1 mL/min (A) (by C-G formula based on SCr of 2.69 mg/dL (H)). ------------------------------------------------------------------------------------------------------------------ No results for input(s): TSH, T4TOTAL, T3FREE, THYROIDAB in the last 72 hours.  Invalid input(s): FREET3   Coagulation  profile  Recent Labs Lab 08/20/16 1518  INR 1.18   ------------------------------------------------------------------------------------------------------------------- No results for input(s): DDIMER in the last 72 hours. -------------------------------------------------------------------------------------------------------------------  Cardiac Enzymes  Recent Labs Lab 08/20/16 1518  TROPONINI 0.09*   ------------------------------------------------------------------------------------------------------------------ Invalid input(s): POCBNP  ---------------------------------------------------------------------------------------------------------------  Urinalysis No results found for: COLORURINE, APPEARANCEUR, LABSPEC, PHURINE, GLUCOSEU, HGBUR, BILIRUBINUR, KETONESUR, PROTEINUR, UROBILINOGEN, NITRITE, LEUKOCYTESUR   RADIOLOGY: Dg Chest 2 View  Result Date: 08/20/2016 CLINICAL DATA:  Shortness of Breath EXAM: CHEST  2 VIEW COMPARISON:  04/24/2014 FINDINGS: The heart is enlarged but appears stable. The mediastinal and hilar contours are within normal limits unchanged. Mild vascular congestion with possible mild interstitial edema. No infiltrates or effusions. The bony thorax is intact. IMPRESSION: Cardiac enlargement and vascular congestion with  possible mild interstitial edema. No pleural effusions. Electronically Signed   By: Marijo Sanes M.D.   On: 08/20/2016 14:07    EKG: Orders placed or performed during the hospital encounter of 08/20/16  . ED EKG  . ED EKG    IMPRESSION AND PLAN:  * Ac on ch systolic CHF   IV lasix, Fluid restriction, I/O monitor, cardio consult.  * A fib   Cont rate control, Coumadin per pharmacy  * Htn   Cont home meds  * Hyperlipidemia   Cont statin  * DM    Cont home meds, Keep on ISS.  * Sleep apnea   Cont CPAP at night.  All the records are reviewed and case discussed with ED provider. Management plans discussed with the patient,  family and they are in agreement.  CODE STATUS: Full. Code Status History    Date Active Date Inactive Code Status Order ID Comments User Context   04/24/2014  6:06 PM 04/26/2014  8:05 PM Full Code [id number]  Leonie Man, MD Inpatient     His wife was in room.  TOTAL TIME TAKING CARE OF THIS PATIENT: 50 minutes.    Vaughan Basta M.D on 08/20/2016   Between 7am to 6pm - Pager - [phone]  After 6pm go to [email or web address] - password [email or web address] [place] [facility]  Office  [phone]  CC: Primary care physician; Leone Haven, MD   Note: This dictation was prepared with Dragon dictation along with smaller phrase technology. Any transcriptional errors that result from this process are unintentional.

## 2016-08-21 ENCOUNTER — Telehealth: Payer: Self-pay | Admitting: *Deleted

## 2016-08-21 ENCOUNTER — Inpatient Hospital Stay: Payer: Medicare Other

## 2016-08-21 ENCOUNTER — Encounter: Payer: Self-pay | Admitting: Nurse Practitioner

## 2016-08-21 ENCOUNTER — Other Ambulatory Visit: Payer: Self-pay | Admitting: Cardiology

## 2016-08-21 DIAGNOSIS — I5023 Acute on chronic systolic (congestive) heart failure: Secondary | ICD-10-CM

## 2016-08-21 LAB — CBC
HCT: 39.6 % — ABNORMAL LOW (ref 40.0–52.0)
Hemoglobin: 12.9 g/dL — ABNORMAL LOW (ref 13.0–18.0)
MCH: 30.8 pg (ref 26.0–34.0)
MCHC: 32.7 g/dL (ref 32.0–36.0)
MCV: 94.3 fL (ref 80.0–100.0)
Platelets: 212 10*3/uL (ref 150–440)
RBC: 4.2 MIL/uL — ABNORMAL LOW (ref 4.40–5.90)
RDW: 16.2 % — ABNORMAL HIGH (ref 11.5–14.5)
WBC: 8.9 10*3/uL (ref 3.8–10.6)

## 2016-08-21 LAB — GLUCOSE, CAPILLARY
Glucose-Capillary: 147 mg/dL — ABNORMAL HIGH (ref 65–99)
Glucose-Capillary: 128 mg/dL — ABNORMAL HIGH (ref 65–99)

## 2016-08-21 LAB — BASIC METABOLIC PANEL
Anion gap: 8 (ref 5–15)
BUN: 66 mg/dL — ABNORMAL HIGH (ref 6–20)
CO2: 22 mmol/L (ref 22–32)
Calcium: 9 mg/dL (ref 8.9–10.3)
Chloride: 109 mmol/L (ref 101–111)
Creatinine, Ser: 2.7 mg/dL — ABNORMAL HIGH (ref 0.61–1.24)
GFR calc Af Amer: 30 mL/min — ABNORMAL LOW (ref >60)
GFR calc non Af Amer: 26 mL/min — ABNORMAL LOW (ref >60)
Glucose, Bld: 167 mg/dL — ABNORMAL HIGH (ref 65–99)
Potassium: 4.7 mmol/L (ref 3.5–5.1)
Sodium: 139 mmol/L (ref 135–145)

## 2016-08-21 LAB — PROTIME-INR
INR: 1.18
Prothrombin Time: 15.1 seconds (ref 11.4–15.2)

## 2016-08-21 LAB — TROPONIN I: Troponin I: 0.09 ng/mL (ref <0.03)

## 2016-08-21 MED ORDER — METOPROLOL TARTRATE 25 MG PO TABS
25.0000 mg | ORAL_TABLET | Freq: Three times a day (TID) | ORAL | Status: DC
  Administered 2016-08-21 (×2): 25 mg via ORAL
  Filled 2016-08-21 (×2): qty 1

## 2016-08-21 MED ORDER — WARFARIN SODIUM 5 MG PO TABS: 15.0000 mg | ORAL_TABLET | Freq: Once | ORAL | Status: DC

## 2016-08-21 MED ORDER — FUROSEMIDE 40 MG PO TABS: 80.0000 mg | ORAL_TABLET | Freq: Every day | ORAL | Status: DC

## 2016-08-21 MED ORDER — METOPROLOL TARTRATE 25 MG PO TABS: 25.0000 mg | ORAL_TABLET | Freq: Three times a day (TID) | ORAL | 0 refills | Status: AC

## 2016-08-21 MED ORDER — WARFARIN SODIUM 7.5 MG PO TABS: ORAL_TABLET | ORAL | Status: DC

## 2016-08-21 MED ORDER — ALLOPURINOL 100 MG PO TABS: 100.0000 mg | ORAL_TABLET | Freq: Every day | ORAL | 0 refills | Status: AC

## 2016-08-21 MED ORDER — ISOSORBIDE MONONITRATE ER 30 MG PO TB24
15.0000 mg | ORAL_TABLET | Freq: Every day | ORAL | Status: DC
  Administered 2016-08-21: 15 mg via ORAL
  Filled 2016-08-21: qty 1

## 2016-08-21 NOTE — Progress Notes (Signed)
Patient ID: Chad Baker, male   DOB: 13-Apr-1965, 51 y.o.   MRN: [id number] [facility]        [name] was admitted to the Hospital on 08/20/2016 and Discharged  08/21/2016 and should be excused from work/school   for 4 days starting 08/20/2016 , may return to work/school without any restrictions.  Alford HighlandWIETING, Dace Denn M.D on 08/21/2016,at 2:17 PM  [employer] [facility] [facility]    Office  [phone]

## 2016-08-21 NOTE — Discharge Instructions (Signed)
Heart Failure Clinic appointment on Aug 28, 2016 at 11:40am with Chad Kindredina Hackney, FNP. Please call [phone] to reschedule.   Allopurinol needs to be renally dosed at 100mg  po daily Metoprolol started for rapid atrial fibrillation Take coumadin 15mg  po daily at 6 pm unitl inr check on monday

## 2016-08-21 NOTE — Care Management (Signed)
Admitted with exac of CHF.  Independent in all adls, denies issues accessing medical care, obtaining medications or with transportation.  Current with PCP.  Cpap at night.  Is not on chronic home 02.  Has access to scales.  Has never been referred to the heart failure clinic.  Provided brochure.

## 2016-08-21 NOTE — Care Management Important Message (Signed)
Important Message  Patient Details  Name: Chad Baker MRN: [id number] Date of Birth: 24-Feb-1966   Medicare Important Message Given:  Yes  Signed IM notice given     Eber HongGreene, Chad Vidas R, RN 08/21/2016, 11:11 AM

## 2016-08-21 NOTE — Telephone Encounter (Signed)
[facility] Patient has been scheduled for 05/22 for a HFU and INR check

## 2016-08-21 NOTE — Discharge Summary (Signed)
[facility] NAME: Chad Baker    MR#:  [id number]  DATE OF BIRTH:  [birth year]  DATE OF ADMISSION:  08/20/2016 ADMITTING PHYSICIAN: Vaughan Basta, MD  DATE OF DISCHARGE: 08/21/2016  4:23 PM  PRIMARY CARE PHYSICIAN: Leone Haven, MD    ADMISSION DIAGNOSIS:  CHF (congestive heart failure) ([place]) [I50.9] Acute on chronic systolic congestive heart failure (HCC) [I50.23] Elevated troponin I level [R74.8]  DISCHARGE DIAGNOSIS:  Principal Problem:   Acute on chronic systolic CHF (congestive heart failure) ([place])   SECONDARY DIAGNOSIS:   Past Medical History:  Diagnosis Date  . Bilateral lower extremity edema - severe 04/24/2014  . CAD S/P percutaneous coronary angioplasty 04/2014   a. Inf STEMI CATH: LM nl, LAD 20, D1 nl, LCX 20p, RI nl, RCA 100% pRCA (5.86m x247m - 6.19m40meriflex BMS), 80% dRCA (4.5mm27m0mm20m5.1 mm [name] BMS);  b. 04/2014 Echo: EF 45-50%. Basal-mid inferior akinesis with basal inferoseptal akinesis. Moderate LA dilation. RV systolic function moderately reduced with hypokinetic free wall.  . Chronic a-fib (HCC) [place]. coumadin.  . CKD (chronic kidney disease), stage IV (HCC) [facility] hypertension   . Family history of premature CAD 04/24/2014  . Hyperlipidemia with target LDL less than 70   . OSA (obstructive sleep apnea)   . Poorly controlled type 2 diabetes mellitus with circulatory disorder (HCC) [place] [facility] elevation myocardial infarction (STEMI) of inferior wall (HCC) [id number]   [facility] (also with poor glycemic control  . [facility] 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  . Super obesity     HOSPITAL COURSE:   1. Upper respiratory tract infection. Continue inhalers. Try to hold off on antibiotics.  2. Acute on Chronic congestive heart failure (HFmidREF). Patient with mildly reduced ejection fraction (45-50%).  Patient was diuresed with IV Lasix. Patient was started on metoprolol. Hold off on ACE inhibitor with increased creatinine. Can consider outpatient starting of this medication. Patient already on Aldactone. 3. Atrial fibrillation with rapid ventricular response. Coumadin subtherapeutic. Increase Coumadin to 15 mg daily for 3 days and check an INR on Monday. With his creatinine biliary elevated I do not want to give him Lovenox injections. Metoprolol for rate control. 4. History of stroke. On Coumadin and Plavix 5. Morbid obesity and sleep apnea weight loss needed. CPAP at night. 6. Hyperlipidemia unspecified on atorvastatin 7. Chronic kidney disease stage IV. Watch closely as outpatient with diuresis. Recommend a repeat BMP with follow-up appointment. 8. Elevated troponin likely demand ischemia from chronic congestive heart failure and renal failure 9. Type 2 diabetes mellitus. No changes medication. 10. Gout. Renally dosed allopurinol  DISCHARGE CONDITIONS:   Satisfactory  CONSULTS OBTAINED:  Treatment Team:  [facility] DRUG ALLERGIES:   Allergies  Allergen Reactions  . Codeine Nausea And Vomiting  . Penicillins     Has patient had a PCN reaction causing immediate rash, facial/tongue/throat swelling, SOB or lightheadedness with hypotension: Unknown Has patient had a PCN reaction causing severe rash involving mucus membranes or skin necrosis: Unknown Has patient had a PCN reaction that required hospitalization: Unknown Has patient had a PCN reaction occurring within the last 10 years: Unknown If all of the above answers are "NO", then may proceed with Cephalosporin use.   . Lantus [Insulin Glargine]  Itching and Rash    DISCHARGE MEDICATIONS:   Discharge Medication List as of 08/21/2016  2:23 PM    START taking these medications   Details  metoprolol tartrate (LOPRESSOR) 25 MG tablet Take 1 tablet (25 mg total) by mouth 3 (three) times daily before meals., Starting  Fri 08/21/2016, Print      CONTINUE these medications which have CHANGED   Details  allopurinol (ZYLOPRIM) 100 MG tablet Take 1 tablet (100 mg total) by mouth daily., Starting Fri 08/21/2016, Until Sat 08/21/2017, Print    warfarin (COUMADIN) 7.5 MG tablet Take 31m total daily until next INR check, No Print      CONTINUE these medications which have NOT CHANGED   Details  albuterol (PROVENTIL HFA;VENTOLIN HFA) 108 (90 Base) MCG/ACT inhaler Inhale 2 puffs into the lungs every 6 (six) hours as needed for wheezing or shortness of breath., Starting Sat 08/15/2016, Print    atorvastatin (LIPITOR) 80 MG tablet TAKE 1 TABLET BY MOUTH EVERY DAY, Normal    Cholecalciferol 50000 units TABS Take by mouth once a week., Historical Med    Chromium-Cinnamon (CINNAMON PLUS CHROMIUM PO) Take 500 mg by mouth 2 (two) times daily. Reported on 05/15/2015, Until Discontinued, Historical Med    citalopram (CELEXA) 40 MG tablet TAKE 1 TABLET BY MOUTH EVERY DAY, Normal    clopidogrel (PLAVIX) 75 MG tablet Take 1 tablet (75 mg total) by mouth daily with breakfast. Please schedule appt for refills., Starting Mon 07/20/2016, Normal    Cranberry-Vitamin C-Vitamin E 4200-20-3 MG-MG-UNIT CAPS Take 2 capsules by mouth 2 (two) times daily. , Until Discontinued, Historical Med    ezetimibe (ZETIA) 10 MG tablet TAKE 1 TABLET BY MOUTH EVERY DAY, Normal    fluticasone (FLONASE) 50 MCG/ACT nasal spray Place 2 sprays into both nostrils daily., Starting Sat 08/15/2016, Until Sun 08/15/2017, Print    furosemide (LASIX) 80 MG tablet TAKE 1 TABLET BY MOUTH DAILY, Normal    glimepiride (AMARYL) 4 MG tablet TAKE 1 TABLET(4 MG) BY MOUTH TWICE DAILY, Normal    HUMALOG 100 UNIT/ML injection INJECT 10 UNITS UNDER THE SKIN THREE TIMES DAILY WITH MEALS, Normal    hydrALAZINE (APRESOLINE) 100 MG tablet TAKE 1 TABLET(100 MG) BY MOUTH TWICE DAILY, Normal    Insulin Detemir (LEVEMIR) 100 UNIT/ML Pen Inject 10 Units into the skin daily at 10  pm., Starting Wed 03/18/2016, Normal    liraglutide 18 MG/3ML SOPN Inject 0.3 mLs (1.8 mg total) into the skin daily., Starting Wed 06/03/2016, Normal    spironolactone (ALDACTONE) 25 MG tablet Take 1 tablet (25 mg total) by mouth daily., Starting Thu 03/05/2016, Normal    B-D UF III MINI PEN NEEDLES 31G X 5 MM MISC USE AS DIRECTED WITH LEVEMIR, Normal    benzonatate (TESSALON PERLES) 100 MG capsule Take 1 capsule (100 mg total) by mouth 3 (three) times daily as needed for cough., Starting Sat 08/15/2016, Until Sun 08/15/2017, Print    glucose blood test strip 1 each by Other route as needed for other. Use as instructed with Freestyle monitor, Until Discontinued, Historical Med    glucose monitoring kit (FREESTYLE) monitoring kit 1 each by Does not apply route as needed for other., Starting 04/26/2014, Until Discontinued, Print    nitroGLYCERIN (NITROSTAT) 0.4 MG SL tablet Place 1 tablet (0.4 mg total) under the tongue every 5 (five) minutes as needed for chest pain., Starting 05/29/2014, Until Discontinued, Normal    Syringe/Needle, Disp, 28G X 1/2" 1 ML MISC 1 Units/hr by  Does not apply route 3 (three) times daily with meals., Starting Tue 01/07/2016, Normal      STOP taking these medications     carvedilol (COREG) 25 MG tablet          DISCHARGE INSTRUCTIONS:   Follow-up PMD one week Follow-up cardiology team one week  If you experience worsening of your admission symptoms, develop shortness of breath, life threatening emergency, suicidal or homicidal thoughts you must seek medical attention immediately by calling 911 or calling your MD immediately  if symptoms less severe.  You Must read complete instructions/literature along with all the possible adverse reactions/side effects for all the Medicines you take and that have been prescribed to you. Take any new Medicines after you have completely understood and accept all the possible adverse reactions/side effects.   Please  note  You were cared for by a hospitalist during your hospital stay. If you have any questions about your discharge medications or the care you received while you were in the hospital after you are discharged, you can call the unit and asked to speak with the hospitalist on call if the hospitalist that took care of you is not available. Once you are discharged, your primary care physician will handle any further medical issues. Please note that NO REFILLS for any discharge medications will be authorized once you are discharged, as it is imperative that you return to your primary care physician (or establish a relationship with a primary care physician if you do not have one) for your aftercare needs so that they can reassess your need for medications and monitor your lab values.    Today   CHIEF COMPLAINT:   Chief Complaint  Patient presents with  . Shortness of Breath    HISTORY OF PRESENT ILLNESS:  Chad Baker  is a 51 y.o. male with a known history of CHF presents with shortness of breath   VITAL SIGNS:  Blood pressure 116/62, pulse 84, temperature 98.1 F (36.7 C), temperature source Oral, resp. rate 19, height _0  (1.905 m), weight (!) 211.3 kg (465 lb 11.9 oz), SpO2 94 %.   PHYSICAL EXAMINATION:  GENERAL:  51 y.o.-year-old patient lying in the bed with no acute distress.  EYES: Pupils equal, round, reactive to light and accommodation. No scleral icterus. Extraocular muscles intact.  HEENT: Head atraumatic, normocephalic. Oropharynx and nasopharynx clear.  NECK:  Supple, no jugular venous distention. No thyroid enlargement, no tenderness.  LUNGS: Decreased breath sounds bilaterally bases, no wheezing, rales,rhonchi or crepitation. No use of accessory muscles of respiration.  CARDIOVASCULAR: S1, S2 normal. No murmurs, rubs, or gallops.  ABDOMEN: Soft, non-tender, non-distended. Bowel sounds present. No organomegaly or mass.  EXTREMITIES: 3+ edema, no cyanosis, or clubbing.   NEUROLOGIC: Cranial nerves II through XII are intact. Muscle strength 5/5 in all extremities. Sensation intact. Gait not checked.  PSYCHIATRIC: The patient is alert and oriented x 3.  SKIN: Chronic lower extremity discoloration  DATA REVIEW:   CBC  Recent Labs Lab 08/21/16 0253  WBC 8.9  HGB 12.9*  HCT 39.6*  PLT 212    Chemistries   Recent Labs Lab 08/21/16 0253  NA 139  K 4.7  CL 109  CO2 22  GLUCOSE 167*  BUN 66*  CREATININE 2.70*  CALCIUM 9.0    Cardiac Enzymes  Recent Labs Lab 08/21/16 0253  TROPONINI 0.09*    Microbiology Results  Results for orders placed or performed during the hospital encounter of 04/24/14  MRSA PCR Screening  Status: Abnormal   Collection Time: 04/24/14  6:17 PM  Result Value Ref Range Status   MRSA by PCR POSITIVE (A) NEGATIVE Final    Comment:        The GeneXpert MRSA Assay (FDA approved for NASAL specimens only), is one component of a comprehensive MRSA colonization surveillance program. It is not intended to diagnose MRSA infection nor to guide or monitor treatment for MRSA infections. RESULT CALLED TO, READ BACK BY AND VERIFIED WITH: Carolee Rota RN 1937 04/24/14 A BROWNING     RADIOLOGY:  Dg Chest 2 View  Result Date: 08/21/2016 CLINICAL DATA:  Tachycardia.  Congestive heart failure EXAM: CHEST  2 VIEW COMPARISON:  Aug 20, 2016 FINDINGS: There remains cardiomegaly with pulmonary venous hypertension. There is trace interstitial edema. No airspace consolidation or pleural effusion. No adenopathy. No bone lesions. IMPRESSION: Persistent pulmonary vascular congestion with its trace interstitial edema. Findings indicate a degree of congestive heart failure. Stable cardiomegaly. No airspace consolidation. Appearance stable compared to 1 day prior. Electronically Signed   By: Lowella Grip III M.D.   On: 08/21/2016 08:01   Dg Chest 2 View  Result Date: 08/20/2016 CLINICAL DATA:  Shortness of Breath EXAM: CHEST  2  VIEW COMPARISON:  04/24/2014 FINDINGS: The heart is enlarged but appears stable. The mediastinal and hilar contours are within normal limits unchanged. Mild vascular congestion with possible mild interstitial edema. No infiltrates or effusions. The bony thorax is intact. IMPRESSION: Cardiac enlargement and vascular congestion with possible mild interstitial edema. No pleural effusions. Electronically Signed   By: Marijo Sanes M.D.   On: 08/20/2016 14:07     Management plans discussed with the patient, and he is in agreement.  CODE STATUS:     Code Status Orders        Start     Ordered   08/20/16 1838  Full code  Continuous     08/20/16 1837    Code Status History    Date Active Date Inactive Code Status Order ID Comments User Context   04/24/2014  6:06 PM 04/26/2014  8:05 PM Full Code [id number]  Leonie Man, MD Inpatient      TOTAL TIME TAKING CARE OF THIS PATIENT: 35 minutes.    Loletha Grayer M.D on 08/21/2016 at 4:57 PM  Between 7am to 6pm - Pager - [phone]  After 6pm go to [email or web address] - password [email or web address]  [facility] Office  [phone]  CC: Primary care physician; Leone Haven, MD

## 2016-08-21 NOTE — Discharge Planning (Signed)
Patient IV and tele removed.  Discharge papers given, explained and educated.  Informed about suggested FU appts and appts made.  Patient is going to PCP on Monday 5/21 to get lab draw for INR. [facility]  RN assessment and VS revealed stability for DC to home.  Once ready, patient will be wheeled to front and family transporting home via car.

## 2016-08-21 NOTE — Progress Notes (Signed)
Rec'd report from Nurse Yasmin that on-call doctor made aware of patient's heart rate elevated up to the 140's especially at exertion. Once patient is in the bed heart rate stays around 110 bpm. Order given for Lopressor PRN with parameters. First dose administered  As ordered PRN. Currently complains of no pain or discomfort. Will continue to monitor patient to end of shift.

## 2016-08-21 NOTE — Consult Note (Signed)
Cardiology Consult    Patient ID: Chad Baker MRN: [id number], DOB/AGE: 50-Dec-1967   Admit date: 08/20/2016 Date of Consult: 08/21/2016  Primary Physician: Glori LuisSonnenberg, Eric G, MD Primary Cardiologist: Ranae Palms. Harding, MD  Requesting Provider: R. Wieting  Patient Profile    Chad Baker is a 51 y.o. male with a history of CAD s/p inferior STEMI and PCI of the RCA, who is , chronic afib on coumadin, HTN, HL, DM, OSA, morbid obesity, and mild LV dysfxn with chronic lower ext edema, being seen today for the evaluation of dyspnea and elevated troponin at the request of Dr. Renae GlossWieting.  Past Medical History   Past Medical History:  Diagnosis Date  . Bilateral lower extremity edema - severe 04/24/2014  . CAD S/P percutaneous coronary angioplasty 04/2014   a. Inf STEMI CATH: LM nl, LAD 20, D1 nl, LCX 20p, RI nl, RCA 100% pRCA (5.270mm x2128mm  - 6.261mm Veriflex BMS), 80% dRCA (4.85mm x6420mm -> 5.1 mm [name] BMS);  b. 04/2014 Echo: EF 45-50%. Basal-mid inferior akinesis with basal inferoseptal akinesis. Moderate LA dilation. RV systolic function moderately reduced with hypokinetic free wall.  . Chronic a-fib (HCC)    a. coumadin.  . CKD (chronic kidney disease), stage IV (HCC)   . Essential hypertension   . Family history of premature CAD 04/24/2014  . Hyperlipidemia with target LDL less than 70   . OSA (obstructive sleep apnea)   . Poorly controlled type 2 diabetes mellitus with circulatory disorder (HCC)   . ST elevation myocardial infarction (STEMI) of inferior wall (HCC) 04/2014   [facility] due to patient's weight > [facility] Cath Table Limit (also with poor glycemic control  . Stroke [facility](HCC)    a. 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  . Super obesity     Past Surgical History:  Procedure Laterality Date  . LEFT HEART CATHETERIZATION WITH CORONARY ANGIOGRAM N/A 04/24/2014   Procedure: LEFT HEART CATHETERIZATION WITH CORONARY ANGIOGRAM;  Surgeon: Bryan Lemmaavid Harding, MD, [facility]  LAB; Very Large RCA -> pRCA 100%, dRCA 80%  . PERCUTANEOUS CORONARY STENT INTERVENTION (PCI-S)  04/24/2014   Aspiration Thrombectomy --> pRCA VeriFlex BMS 5.0 x 28 (6.1 mm), dRCA (@ crux) [name] BMS 4.5 x 20 (5.1 mm)  . TRANSTHORACIC ECHOCARDIOGRAM  January 2016   EF 45-50%. Basal and mid inferior akinesis and basal inferoseptal akinesis. Moderate LA dilation, RV systolic function moderately reduced with hypokinetic free wall.     Allergies  Allergies  Allergen Reactions  . Codeine Nausea And Vomiting  . Penicillins     Has patient had a PCN reaction causing immediate rash, facial/tongue/throat swelling, SOB or lightheadedness with hypotension: Unknown Has patient had a PCN reaction causing severe rash involving mucus membranes or skin necrosis: Unknown Has patient had a PCN reaction that required hospitalization: Unknown Has patient had a PCN reaction occurring within the last 10 years: Unknown If all of the above answers are "NO", then may proceed with Cephalosporin use.   . Lantus [Insulin Glargine] Itching and Rash    History of Present Illness    51 y/o ? with a history of CAD s/p inferior STEMI and PCI/BMS of the RCA x 2 in 04/2014.  Echo @ the time showed an EF of 45-50%.  Other hx includes chronic afib on coumadin (CHA2DS2VASc = 4), HTN, HL, DM, OSA, morbid obesity and chronic bilat LE edema/venous stasis.  He lives locally with his wife and two step-children.  He does not routinely exercise.   He was in his usoh until ~ 6 days ago, when he began to experience cough and upper resp congestion.  He was seen in the ED on 5/12 and Rx inhalers and cough medicine.  Over the subsequent 5 days, he noted some improvement in breathing with inhalers.  He did not note any change in chronic lower ext swelling.  He does not weigh himself @ home.  On the morning of 5/17, he awoke with incessant coughing.  He wasn't necessarily short of breath.  Due to persistent cough, he presented back to the [facility]  ED.  There, ECG was non-acute, however CXR showed vascular congestion.  He was admitted for further evaluation and placed on IV lasix.  His cough is much improved.  Breathing stable.  He denies chest pain, palpitations, pnd, orthopnea, n, v, dizziness, syncope, or early satiety.   Inpatient Medications    . allopurinol  300 mg Oral Daily  . atorvastatin  80 mg Oral Daily  . citalopram  40 mg Oral Daily  . clopidogrel  75 mg Oral Q breakfast  . ezetimibe  10 mg Oral Daily  . fluticasone  2 spray Each Nare Daily  . furosemide  40 mg Intravenous Q12H  . heparin  5,000 Units Subcutaneous Q8H  . hydrALAZINE  50 mg Oral Q8H  . insulin aspart  0-9 Units Subcutaneous TID WC  . insulin aspart  10 Units Subcutaneous TID WC  . insulin detemir  10 Units Subcutaneous Q2200  . metoprolol tartrate  25 mg Oral TID AC  . spironolactone  25 mg Oral Daily  . Warfarin - Pharmacist Dosing Inpatient   Does not apply q1800    Family History    Family History  Problem Relation Age of Onset  . Heart disease Father   . Heart disease Brother     Social History    Social History   Social History  . Marital status: Single    Spouse name: N/A  . Number of children: N/A  . Years of education: N/A   Occupational History  . Cashier     [employer]   Social History Main Topics  . Smoking status: Never Smoker  . Smokeless tobacco: Never Used  . Alcohol use No  . Drug use: No  . Sexual activity: Yes    Partners: Female     Comment: 1 partner    Other Topics Concern  . Not on file   Social History Narrative   Originally from [place], moved to [place]. Lives with wife and 2 step children.  Does not routinely exercise.        Review of Systems    General:  No chills, fever, night sweats or weight changes.  Cardiovascular:  No chest pain, +++ dyspnea on exertion, +++ chronic bilat LE edema with venous stasis changes, no orthopnea, palpitations, paroxysmal nocturnal  dyspnea. Dermatological: No rash, lesions/masses Respiratory: +++ cough, dyspnea Urologic: No hematuria, dysuria Abdominal:   No nausea, vomiting, diarrhea, bright red blood per rectum, melena, or hematemesis Neurologic:  No visual changes, wkns, changes in mental status. All other systems reviewed and are otherwise negative except as noted above.  Physical Exam    Blood pressure (!) 132/96, pulse 100, temperature 98 F (36.7 C), resp. rate 18, height 6\' 3"  (1.905 m), weight (!) 465 lb 11.9 oz (211.3 kg), SpO2 96 %.  General: Pleasant, NAD Psych: Normal affect. Neuro: Alert and oriented X 3. Moves  all extremities spontaneously. HEENT: Normal  Neck: Supple, obese, unable to gauge jvp.  No bruits. Lungs:  Resp regular and unlabored, coarse breath sounds/rhonchi and insp/exp wheezing throughout. Heart: IR, IR no s3, s4, or murmurs. Abdomen: Soft, obese, non-tender, non-distended, BS + x 4.  Extremities: No clubbing, cyanosis.  Chronic venous stasis changes.  1+ bilat LE edema. DP/PT/Radials 2+ and equal bilaterally.  Labs     Recent Labs  08/20/16 1518 08/20/16 1847 08/20/16 2235 08/21/16 0253  TROPONINI 0.09* 0.09* 0.08* 0.09*   Lab Results  Component Value Date   WBC 8.9 08/21/2016   HGB 12.9 (L) 08/21/2016   HCT 39.6 (L) 08/21/2016   MCV 94.3 08/21/2016   PLT 212 08/21/2016    Recent Labs Lab 08/21/16 0253  NA 139  K 4.7  CL 109  CO2 22  BUN 66*  CREATININE 2.70*  CALCIUM 9.0  GLUCOSE 167*   Lab Results  Component Value Date   CHOL 129 06/11/2016   HDL 34.80 (L) 06/11/2016   LDLCALC 72 06/11/2016   TRIG 110.0 06/11/2016     Radiology Studies    Dg Chest 2 View  Result Date: 08/21/2016 CLINICAL DATA:  Tachycardia.  Congestive heart failure EXAM: CHEST  2 VIEW COMPARISON:  Aug 20, 2016 FINDINGS: There remains cardiomegaly with pulmonary venous hypertension. There is trace interstitial edema. No airspace consolidation or pleural effusion. No adenopathy.  No bone lesions. IMPRESSION: Persistent pulmonary vascular congestion with its trace interstitial edema. Findings indicate a degree of congestive heart failure. Stable cardiomegaly. No airspace consolidation. Appearance stable compared to 1 day prior. Electronically Signed   By: Bretta Bang III M.D.   On: 08/21/2016 08:01   Dg Chest 2 View  Result Date: 08/20/2016 CLINICAL DATA:  Shortness of Breath EXAM: CHEST  2 VIEW COMPARISON:  04/24/2014 FINDINGS: The heart is enlarged but appears stable. The mediastinal and hilar contours are within normal limits unchanged. Mild vascular congestion with possible mild interstitial edema. No infiltrates or effusions. The bony thorax is intact. IMPRESSION: Cardiac enlargement and vascular congestion with possible mild interstitial edema. No pleural effusions. Electronically Signed   By: Rudie Meyer M.D.   On: 08/20/2016 14:07    ECG & Cardiac Imaging    AFib, 102, right axis, IVCD, ant infarct, non-specific st/t changes.  Assessment & Plan    1.  URI:  Pt presented 5/12 with cough and congestion.  He had some improvement over the week with inhaler therapy but developed incessant coughing on 3/17, prompting ER eval.  CXR suggested mild vasc congestion.  Afebrile.  WBC nl.  Breathing/coughing improved this am.  Exam notable for significant rhonchi/wheezing.  Inhalers/nebs per IM.  2.  Acute on chronic combined syst/diastolic CHF:  Mild vascular congestion noted on cxr on 5/17.  He has chronic lower ext edema. Exam otw difficult 2/2 body habitus.  Wt is 6 lbs above previously recorded wt.  Minus 1.5L overnight.  Creat slightly higher than prior recordings (now 2.7, was 2.48 in March).  Suspect he will not tolerate too much more diuresis.  He is on lasix 80mg  po daily @ home.  Plan to resume that tomorrow.  Cont  blocker, hydral, spiro.  Add nitrate.  No ACEI/ARB/ARNI in setting of CKD IV.  3.  CAD:  S/p prior Inf STEMI and RCA BMS x 2 (4.5 - 5 mm vessel).   No recent c/p.  Trop mildly elevated in the setting of #1 & 2 with flat trend (0.09  0.09  0.08  0.09).  ECG non-acute.  Most likely demand ischemia in the setting of resp distress.  F/u echo.  Body habitus prevents any sort of noninvasive ischemic eval.  Plan conservative Rx.  Cont plavix, statin,  blocker.  No asa 2/2 chronic coumadin.  4.  Essential HTN:  BP elevated on admission.  Improved this am.  Cont current meds with plan to transition to PO lasix.  5.  HL:  LDL recently 72.  Was 46 in 06/2015.  ? Compliance with outpt meds.  6.  Chronic afib:  Rate up in setting of above.  Cont  blocker.  7.  Morbid obesity:  Sedentary lifestyle.  Needs nutritional counseling and obesity clinic referral.  8.  CKD IV:  Creat up slightly above baseline.  Follow.  Signed, Nicolasa Ducking, NP 08/21/2016, 9:10 AM

## 2016-08-21 NOTE — Progress Notes (Signed)
ANTICOAGULATION CONSULT NOTE - Initial Consult  Pharmacy Consult for  Warfarin dosing  Indication: atrial fibrillation  Allergies  Allergen Reactions  . Codeine Nausea And Vomiting  . Penicillins     Has patient had a PCN reaction causing immediate rash, facial/tongue/throat swelling, SOB or lightheadedness with hypotension: Unknown Has patient had a PCN reaction causing severe rash involving mucus membranes or skin necrosis: Unknown Has patient had a PCN reaction that required hospitalization: Unknown Has patient had a PCN reaction occurring within the last 10 years: Unknown If all of the above answers are "NO", then may proceed with Cephalosporin use.   . Lantus [Insulin Glargine] Itching and Rash    Patient Measurements: Height: 6\' 3"  (190.5 cm) Weight: (!) 465 lb 11.9 oz (211.3 kg) IBW/kg (Calculated) : 84.5  Vital Signs: Temp: 98 F (36.7 C) (05/18 0523) BP: 132/96 (05/18 0523) Pulse Rate: 100 (05/18 0523)  Labs:  Recent Labs  08/20/16 1518 08/20/16 1847 08/20/16 2235 08/21/16 0253  HGB 14.1  --   --  12.9*  HCT 42.8  --   --  39.6*  PLT 206  --   --  212  LABPROT 15.1  --   --  15.1  INR 1.18  --   --  1.18  CREATININE 2.69*  --   --  2.70*  TROPONINI 0.09* 0.09* 0.08* 0.09*    Estimated Creatinine Clearance: 63.3 mL/min (A) (by C-G formula based on SCr of 2.7 mg/dL (H)).   Medical History: Past Medical History:  Diagnosis Date  . Bilateral lower extremity edema - severe 04/24/2014  . CAD S/P percutaneous coronary angioplasty 04/2014   a. Inf STEMI CATH: LM nl, LAD 20, D1 nl, LCX 20p, RI nl, RCA 100% pRCA (5.400mm x8328mm  - 6.641mm Veriflex BMS), 80% dRCA (4.605mm x3920mm -> 5.1 mm [name] BMS);  b. 04/2014 Echo: EF 45-50%. Basal-mid inferior akinesis with basal inferoseptal akinesis. Moderate LA dilation. RV systolic function moderately reduced with hypokinetic free wall.  . Chronic a-fib (HCC)    a. coumadin.  . CKD (chronic kidney disease), stage IV (HCC)   .  Essential hypertension   . Family history of premature CAD 04/24/2014  . Hyperlipidemia with target LDL less than 70   . OSA (obstructive sleep apnea)   . Poorly controlled type 2 diabetes mellitus with circulatory disorder (HCC)   . ST elevation myocardial infarction (STEMI) of inferior wall (HCC) 04/2014   [facility] to patient's [facility] > [facility] Cath Table Limit (also with poor glycemic control  . [facility])    a. 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  . Super obesity     Assessment: 51 yo male with PMH of A. Fib, admitted with Acute on chronic CHF. Pharmacy consulted for warfarin dosing and monitoring. INR on admission subtherapeutic at 1.18  Home Regimen:  Ulanda Edisonues, Thurs, Sat: 15mg                               Sun, Mon, Wed, Fri: 10mg   DATE  INR DOSE 5/17  1.18 15mg  5/18                 1.18     15mg    Goal of Therapy:  INR 2-3 Monitor platelets by anticoagulation protocol: Yes   Plan:  Will order warfarin 15mg  x 1 dose and recheck INR with am labs.  Clovia Cuff, PharmD, BCPS 08/21/2016 11:11 AM

## 2016-08-22 LAB — HIV ANTIBODY (ROUTINE TESTING W REFLEX): HIV Screen 4th Generation wRfx: NONREACTIVE

## 2016-08-24 ENCOUNTER — Other Ambulatory Visit (INDEPENDENT_AMBULATORY_CARE_PROVIDER_SITE_OTHER): Payer: Medicare Other

## 2016-08-24 ENCOUNTER — Other Ambulatory Visit: Payer: Self-pay | Admitting: Radiology

## 2016-08-24 ENCOUNTER — Telehealth: Payer: Self-pay | Admitting: Radiology

## 2016-08-24 DIAGNOSIS — Z5181 Encounter for therapeutic drug level monitoring: Secondary | ICD-10-CM

## 2016-08-24 DIAGNOSIS — R748 Abnormal levels of other serum enzymes: Secondary | ICD-10-CM | POA: Diagnosis not present

## 2016-08-24 LAB — PROTIME-INR
INR: 2.1 ratio — ABNORMAL HIGH (ref 0.8–1.0)
Prothrombin Time: 22.3 s — ABNORMAL HIGH (ref 9.6–13.1)

## 2016-08-24 NOTE — Telephone Encounter (Signed)
Transition Care Management Follow-up Telephone Call  How have you been since you were released from the hospital? I have felt pretty good. Denies chest pain, SOB.   Do you understand why you were in the hospital? Yes, CHF and bronchitis   Do you understand the discharge instrcutions? Yes  Items Reviewed:  Medications reviewed: yes  Allergies reviewed: yes  Dietary changes reviewed: Yes  Referrals reviewed: yes   Functional Questionnaire:   Activities of Daily Living (ADLs):   He states they are independent in the following: Independent in all ADLs. States they require assistance with the following: No assist needed.   Any transportation issues/concerns?: No   Any patient concerns? No   Confirmed importance and date/time of follow-up visits scheduled: yes   Confirmed with patient if condition begins to worsen call PCP or go to the ER.  Patient was given the Call-a-Nurse line [phone]: yes

## 2016-08-24 NOTE — Telephone Encounter (Signed)
Order placed

## 2016-08-24 NOTE — Telephone Encounter (Signed)
PT came in today for INR check that was scheduled by front staff with no orders. Please place future orders. Thank you.

## 2016-08-24 NOTE — Addendum Note (Signed)
Addended by: Birdie SonsSONNENBERG, Ricki Vanhandel G on: 08/24/2016 12:15 PM   Modules accepted: Orders

## 2016-08-25 ENCOUNTER — Ambulatory Visit (INDEPENDENT_AMBULATORY_CARE_PROVIDER_SITE_OTHER): Payer: Medicare Other | Admitting: Family Medicine

## 2016-08-25 ENCOUNTER — Encounter: Payer: Self-pay | Admitting: Family Medicine

## 2016-08-25 VITALS — BP 120/82 | HR 84 | Temp 98.4°F | Wt >= 6400 oz

## 2016-08-25 DIAGNOSIS — B372 Candidiasis of skin and nail: Secondary | ICD-10-CM | POA: Diagnosis not present

## 2016-08-25 DIAGNOSIS — I482 Chronic atrial fibrillation, unspecified: Secondary | ICD-10-CM

## 2016-08-25 DIAGNOSIS — J069 Acute upper respiratory infection, unspecified: Secondary | ICD-10-CM

## 2016-08-25 DIAGNOSIS — N184 Chronic kidney disease, stage 4 (severe): Secondary | ICD-10-CM

## 2016-08-25 DIAGNOSIS — I5023 Acute on chronic systolic (congestive) heart failure: Secondary | ICD-10-CM

## 2016-08-25 LAB — BASIC METABOLIC PANEL
BUN: 53 mg/dL — ABNORMAL HIGH (ref 6–23)
CO2: 21 mEq/L (ref 19–32)
Calcium: 9.3 mg/dL (ref 8.4–10.5)
Chloride: 110 mEq/L (ref 96–112)
Creatinine, Ser: 2.75 mg/dL — ABNORMAL HIGH (ref 0.40–1.50)
GFR: 26.17 mL/min — ABNORMAL LOW (ref >60.00)
Glucose, Bld: 101 mg/dL — ABNORMAL HIGH (ref 70–99)
Potassium: 5 mEq/L (ref 3.5–5.1)
Sodium: 140 mEq/L (ref 135–145)

## 2016-08-25 MED ORDER — NYSTATIN 100000 UNIT/GM EX POWD: Freq: Three times a day (TID) | CUTANEOUS | 0 refills | Status: AC

## 2016-08-25 MED ORDER — DOXYCYCLINE HYCLATE 100 MG PO TABS: 100.0000 mg | ORAL_TABLET | Freq: Two times a day (BID) | ORAL | 0 refills | Status: AC

## 2016-08-25 NOTE — Patient Instructions (Signed)
Nice to see you. I am glad you are you feeling slightly better. We'll check your kidney function today. We'll start you on topical nystatin powder for the rash in your groin though we are also going to start you on an antibiotic called doxycycline. We will see you back in 2 days to recheck. If you develop fevers, spreading redness while on the antibiotic, or any new or change in symptoms please seek medical attention immediately.

## 2016-08-25 NOTE — Assessment & Plan Note (Signed)
On Coumadin. Recent INR within the acceptable range. He'll continue with his prior daily dosing of this and follow-up with Coumadin clinic in 2 weeks for recheck of INR. We will plan to recheck it in 2 days given he is on doxycycline as well.

## 2016-08-25 NOTE — Assessment & Plan Note (Signed)
Overall improving. Breathing is better. Vital signs stable. Lung exam normal. Stable chronic venous insufficiency edema and skin changes in his legs. He'll continue Lasix. We'll check a BMP today to ensure his kidney function has tolerated the IV Lasix he received in the hospital.

## 2016-08-25 NOTE — Assessment & Plan Note (Signed)
Improving following hospitalization. Lungs sounds normal. Symptoms overall improved. Continue albuterol as needed. If worsens will follow-up.

## 2016-08-25 NOTE — Progress Notes (Signed)
Marikay AlarEric Dilan Fullenwider, MD Phone: [phone]  Chad Baker is a 51 y.o. male who presents today for hospital follow-up.  Patient recently hospitalized for upper respiratory infection and CHF exacerbation. He had uncontrollable cough. They started him on albuterol inhaler and had him do IV Lasix. His cough has improved. Not as bad as previously. Not productive anymore. He has been breathing better. Notes swelling has gone down to some degree. Does note a rash and discomfort in his left groin. Notes it is weeping and smells. He has had this previously. He notes the Lasix helped the swelling in this area go down though once he got back up on his feet this morning it returned. There is some redness. He's been using baby powder. There is some pain in the area. No fevers. He had his INR checked yesterday and it was in the acceptable range. He is to resume his normal Coumadin dose and follow up with the Coumadin clinic in 2 weeks. He is due to have a BMP today. Discharge summary has been reviewed. Patient was hospitalized from 08/20/16-08/21/16.  PMH: nonsmoker.   ROS see history of present illness  Objective  Physical Exam Vitals:   08/25/16 1121 08/25/16 1147  BP: 120/82   Pulse: (!) 101 84  Temp: 98.4 F (36.9 C)     BP Readings from Last 3 Encounters:  08/25/16 120/82  08/21/16 116/62  08/15/16 (!) 141/86   Wt Readings from Last 3 Encounters:  08/25/16 (!) 455 lb 9.6 oz (206.7 kg)  08/21/16 (!) 465 lb 11.9 oz (211.3 kg)  08/15/16 (!) 460 lb (208.7 kg)    Physical Exam  Constitutional: No distress.  HENT:  Head: Normocephalic and atraumatic.  Mouth/Throat: Oropharynx is clear and moist. No oropharyngeal exudate.  Eyes: Conjunctivae are normal. Pupils are equal, round, and reactive to light.  Cardiovascular: Normal rate and normal heart sounds.  An irregularly irregular rhythm present.  Pulmonary/Chest: Effort normal and breath sounds normal.  Musculoskeletal:  Chronic lower extremity  edema with venous stasis changes unchanged from prior  Neurological: He is alert. Gait normal.  Skin: Skin is warm and dry. He is not diaphoretic.     Erythematous rash does not involve scrotum or perineal area     Assessment/Plan: Please see individual problem list.  Acute URI Improving following hospitalization. Lungs sounds normal. Symptoms overall improved. Continue albuterol as needed. If worsens will follow-up.  Acute on chronic systolic CHF (congestive heart failure) (HCC) Overall improving. Breathing is better. Vital signs stable. Lung exam normal. Stable chronic venous insufficiency edema and skin changes in his legs. He'll continue Lasix. We'll check a BMP today to ensure his kidney function has tolerated the IV Lasix he received in the hospital.  Candidal intertrigo Suspect patient's inguinal rash is candidal intertrigo given location though could also have secondary bacterial infection given degree of induration and discomfort. We will provide coverage for both with nystatin powder given weeping nature of the lesion and will cover with doxycycline for possible secondary bacterial infection. Discussed risk of increased INR with doxycycline and advised of bleeding precautions. We'll plan on rechecking INR in 2 days when he follows up for this issue. He is given return precautions.  Chronic atrial fibrillation (HCC) -- CHA2DS2Vasc Score 5, On Warfarin On Coumadin. Recent INR within the acceptable range. He'll continue with his prior daily dosing of this and follow-up with Coumadin clinic in 2 weeks for recheck of INR. We will plan to recheck it in 2 days given he  is on doxycycline as well.   Orders Placed This Encounter  Procedures  . Basic Metabolic Panel (BMET)    Meds ordered this encounter  Medications  . nystatin (MYCOSTATIN/NYSTOP) powder    Sig: Apply topically 3 (three) times daily.    Dispense:  45 g    Refill:  0  . doxycycline (VIBRA-TABS) 100 MG tablet    Sig:  Take 1 tablet (100 mg total) by mouth 2 (two) times daily.    Dispense:  14 tablet    Refill:  0    Marikay Alar, MD [facility]

## 2016-08-25 NOTE — Assessment & Plan Note (Addendum)
Suspect patient's inguinal rash is candidal intertrigo given location though could also have secondary bacterial infection given degree of induration and discomfort. We will provide coverage for both with nystatin powder given weeping nature of the lesion and will cover with doxycycline for possible secondary bacterial infection. Discussed risk of increased INR with doxycycline and advised of bleeding precautions. We'll plan on rechecking INR in 2 days when he follows up for this issue. He is given return precautions.

## 2016-08-28 ENCOUNTER — Telehealth: Payer: Self-pay | Admitting: Family

## 2016-08-28 ENCOUNTER — Encounter: Payer: Self-pay | Admitting: Family Medicine

## 2016-08-28 ENCOUNTER — Ambulatory Visit: Payer: Medicare Other | Admitting: Family

## 2016-08-28 ENCOUNTER — Ambulatory Visit (INDEPENDENT_AMBULATORY_CARE_PROVIDER_SITE_OTHER): Payer: Medicare Other | Admitting: Family Medicine

## 2016-08-28 VITALS — BP 118/80 | HR 81 | Temp 98.0°F | Wt >= 6400 oz

## 2016-08-28 DIAGNOSIS — B372 Candidiasis of skin and nail: Secondary | ICD-10-CM

## 2016-08-28 DIAGNOSIS — Z5181 Encounter for therapeutic drug level monitoring: Secondary | ICD-10-CM | POA: Diagnosis not present

## 2016-08-28 LAB — PROTIME-INR
INR: 2.4 ratio — ABNORMAL HIGH (ref 0.8–1.0)
Prothrombin Time: 25.6 s — ABNORMAL HIGH (ref 9.6–13.1)

## 2016-08-28 NOTE — Progress Notes (Signed)
  Chad AlarEric Destynee Stringfellow, MD Phone: [phone]  Chad Baker is a 51 y.o. male who presents today for follow-up.  Patient seen 2 days ago and diagnosed with intertrigo with possible secondary cellulitis. He has been on doxycycline and using topical nystatin powder. He notes he feels better. Not as painful. There is still some swelling in the area though it is improved. He is unable to tell me if it is red. No fevers.  ROS see history of present illness  Objective  Physical Exam Vitals:   08/28/16 0915  BP: 118/80  Pulse: 81  Temp: 98 F (36.7 C)    BP Readings from Last 3 Encounters:  08/28/16 118/80  08/25/16 120/82  08/21/16 116/62   Wt Readings from Last 3 Encounters:  08/28/16 (!) 446 lb 3.2 oz (202.4 kg)  08/25/16 (!) 455 lb 9.6 oz (206.7 kg)  08/21/16 (!) 465 lb 11.9 oz (211.3 kg)    Physical Exam  Constitutional: No distress.  Skin: He is not diaphoretic.        Assessment/Plan: Please see individual problem list.  Candidal intertrigo Significantly improved. Still a few areas of erythema though no longer indurated or tender. We will check an INR today given that he has been on doxycycline. He'll finish his course of doxycycline and continue using nystatin powder. We'll recheck next week when he follows up for normal follow-up. He is given return precautions.   Orders Placed This Encounter  Procedures  . INR/PT   Chad AlarEric Naileah Karg, MD [facility]

## 2016-08-28 NOTE — Assessment & Plan Note (Signed)
Significantly improved. Still a few areas of erythema though no longer indurated or tender. We will check an INR today given that he has been on doxycycline. He'll finish his course of doxycycline and continue using nystatin powder. We'll recheck next week when he follows up for normal follow-up. He is given return precautions.

## 2016-08-28 NOTE — Telephone Encounter (Signed)
Patient missed his initial appointment at the [facility] on 08/28/16. Will attempt to reschedule.  

## 2016-08-28 NOTE — Patient Instructions (Signed)
Nice to see you. The area of concern appears quite a bit improved. You should finish the course of doxycycline. You should also continue to use the topical nystatin powder until the symptoms have resolved completely. If you develop worsening pain or you develop fevers please seek medical attention immediately.

## 2016-09-01 ENCOUNTER — Ambulatory Visit: Payer: Medicare Other | Admitting: Family Medicine

## 2016-09-01 DIAGNOSIS — Z0289 Encounter for other administrative examinations: Secondary | ICD-10-CM

## 2016-09-19 ENCOUNTER — Encounter: Payer: Self-pay | Admitting: Family Medicine

## 2016-09-22 ENCOUNTER — Other Ambulatory Visit: Payer: Self-pay | Admitting: Family Medicine

## 2016-09-22 ENCOUNTER — Other Ambulatory Visit: Payer: Self-pay | Admitting: Cardiology

## 2016-10-19 ENCOUNTER — Other Ambulatory Visit: Payer: Self-pay | Admitting: Family Medicine

## 2016-11-02 ENCOUNTER — Other Ambulatory Visit: Payer: Self-pay | Admitting: Cardiology

## 2016-11-02 NOTE — Telephone Encounter (Signed)
LMOM for patient to call and set INR appt.  Did not show to first HF appt.  Also was called x 3 to schedule 6 mo visit with Dr. Herbie BaltimoreHarding, never returned call.  Now [facility] just [facility] yr [facility]

## 2016-11-04 NOTE — Telephone Encounter (Signed)
I left a message for him to call - looks like you have trouble getting [place]

## 2016-11-25 DIAGNOSIS — M1A09X0 Idiopathic chronic gout, multiple sites, without tophus (tophi): Secondary | ICD-10-CM | POA: Diagnosis not present

## 2016-11-25 DIAGNOSIS — E1169 Type 2 diabetes mellitus with other specified complication: Secondary | ICD-10-CM | POA: Diagnosis not present

## 2016-11-25 DIAGNOSIS — E782 Mixed hyperlipidemia: Secondary | ICD-10-CM | POA: Diagnosis not present

## 2016-11-25 DIAGNOSIS — I10 Essential (primary) hypertension: Secondary | ICD-10-CM | POA: Diagnosis not present

## 2016-12-16 DIAGNOSIS — I83222 Varicose veins of left lower extremity with both ulcer of calf and inflammation: Secondary | ICD-10-CM | POA: Diagnosis not present

## 2016-12-16 DIAGNOSIS — I482 Chronic atrial fibrillation: Secondary | ICD-10-CM | POA: Diagnosis not present

## 2016-12-16 DIAGNOSIS — A09 Infectious gastroenteritis and colitis, unspecified: Secondary | ICD-10-CM | POA: Diagnosis not present

## 2016-12-16 DIAGNOSIS — L97921 Non-pressure chronic ulcer of unspecified part of left lower leg limited to breakdown of skin: Secondary | ICD-10-CM | POA: Diagnosis not present

## 2016-12-16 DIAGNOSIS — I10 Essential (primary) hypertension: Secondary | ICD-10-CM | POA: Diagnosis not present

## 2016-12-16 DIAGNOSIS — E1169 Type 2 diabetes mellitus with other specified complication: Secondary | ICD-10-CM | POA: Diagnosis not present

## 2016-12-16 DIAGNOSIS — R600 Localized edema: Secondary | ICD-10-CM | POA: Diagnosis not present

## 2016-12-24 DIAGNOSIS — R600 Localized edema: Secondary | ICD-10-CM | POA: Diagnosis not present

## 2016-12-24 DIAGNOSIS — I83222 Varicose veins of left lower extremity with both ulcer of calf and inflammation: Secondary | ICD-10-CM | POA: Diagnosis not present

## 2016-12-24 DIAGNOSIS — E1169 Type 2 diabetes mellitus with other specified complication: Secondary | ICD-10-CM | POA: Diagnosis not present

## 2016-12-24 DIAGNOSIS — I87391 Chronic venous hypertension (idiopathic) with other complications of right lower extremity: Secondary | ICD-10-CM | POA: Diagnosis not present

## 2016-12-24 DIAGNOSIS — I10 Essential (primary) hypertension: Secondary | ICD-10-CM | POA: Diagnosis not present

## 2016-12-30 DIAGNOSIS — Z1211 Encounter for screening for malignant neoplasm of colon: Secondary | ICD-10-CM | POA: Diagnosis not present

## 2016-12-30 DIAGNOSIS — I482 Chronic atrial fibrillation: Secondary | ICD-10-CM | POA: Diagnosis not present

## 2016-12-30 DIAGNOSIS — E1169 Type 2 diabetes mellitus with other specified complication: Secondary | ICD-10-CM | POA: Diagnosis not present

## 2016-12-30 DIAGNOSIS — I10 Essential (primary) hypertension: Secondary | ICD-10-CM | POA: Diagnosis not present

## 2016-12-30 DIAGNOSIS — R600 Localized edema: Secondary | ICD-10-CM | POA: Diagnosis not present

## 2016-12-30 DIAGNOSIS — I83893 Varicose veins of bilateral lower extremities with other complications: Secondary | ICD-10-CM | POA: Diagnosis not present

## 2016-12-30 DIAGNOSIS — M25552 Pain in left hip: Secondary | ICD-10-CM | POA: Diagnosis not present

## 2016-12-30 DIAGNOSIS — L97921 Non-pressure chronic ulcer of unspecified part of left lower leg limited to breakdown of skin: Secondary | ICD-10-CM | POA: Diagnosis not present

## 2016-12-30 DIAGNOSIS — K5904 Chronic idiopathic constipation: Secondary | ICD-10-CM | POA: Diagnosis not present

## 2017-01-06 DIAGNOSIS — I83222 Varicose veins of left lower extremity with both ulcer of calf and inflammation: Secondary | ICD-10-CM | POA: Diagnosis not present

## 2017-01-06 DIAGNOSIS — E1169 Type 2 diabetes mellitus with other specified complication: Secondary | ICD-10-CM | POA: Diagnosis not present

## 2017-01-06 DIAGNOSIS — I482 Chronic atrial fibrillation: Secondary | ICD-10-CM | POA: Diagnosis not present

## 2017-01-06 DIAGNOSIS — R011 Cardiac murmur, unspecified: Secondary | ICD-10-CM | POA: Diagnosis not present

## 2017-01-06 DIAGNOSIS — Z1211 Encounter for screening for malignant neoplasm of colon: Secondary | ICD-10-CM | POA: Diagnosis not present

## 2017-01-06 DIAGNOSIS — I10 Essential (primary) hypertension: Secondary | ICD-10-CM | POA: Diagnosis not present

## 2017-01-07 DIAGNOSIS — M19012 Primary osteoarthritis, left shoulder: Secondary | ICD-10-CM | POA: Diagnosis not present

## 2017-01-13 DIAGNOSIS — R296 Repeated falls: Secondary | ICD-10-CM | POA: Diagnosis not present

## 2017-01-13 DIAGNOSIS — N170 Acute kidney failure with tubular necrosis: Secondary | ICD-10-CM | POA: Diagnosis not present

## 2017-01-13 DIAGNOSIS — W1830XA Fall on same level, unspecified, initial encounter: Secondary | ICD-10-CM | POA: Diagnosis not present

## 2017-01-13 DIAGNOSIS — M47812 Spondylosis without myelopathy or radiculopathy, cervical region: Secondary | ICD-10-CM | POA: Diagnosis not present

## 2017-01-13 DIAGNOSIS — M6281 Muscle weakness (generalized): Secondary | ICD-10-CM | POA: Diagnosis not present

## 2017-01-13 DIAGNOSIS — S299XXA Unspecified injury of thorax, initial encounter: Secondary | ICD-10-CM | POA: Diagnosis not present

## 2017-01-13 DIAGNOSIS — I252 Old myocardial infarction: Secondary | ICD-10-CM | POA: Diagnosis not present

## 2017-01-13 DIAGNOSIS — Z8673 Personal history of transient ischemic attack (TIA), and cerebral infarction without residual deficits: Secondary | ICD-10-CM | POA: Diagnosis not present

## 2017-01-13 DIAGNOSIS — E785 Hyperlipidemia, unspecified: Secondary | ICD-10-CM | POA: Diagnosis not present

## 2017-01-13 DIAGNOSIS — R279 Unspecified lack of coordination: Secondary | ICD-10-CM | POA: Diagnosis not present

## 2017-01-13 DIAGNOSIS — S0990XA Unspecified injury of head, initial encounter: Secondary | ICD-10-CM | POA: Diagnosis not present

## 2017-01-13 DIAGNOSIS — I6930 Unspecified sequelae of cerebral infarction: Secondary | ICD-10-CM | POA: Diagnosis not present

## 2017-01-13 DIAGNOSIS — I5042 Chronic combined systolic (congestive) and diastolic (congestive) heart failure: Secondary | ICD-10-CM | POA: Diagnosis not present

## 2017-01-13 DIAGNOSIS — E11649 Type 2 diabetes mellitus with hypoglycemia without coma: Secondary | ICD-10-CM | POA: Diagnosis not present

## 2017-01-13 DIAGNOSIS — Z23 Encounter for immunization: Secondary | ICD-10-CM | POA: Diagnosis not present

## 2017-01-13 DIAGNOSIS — N183 Chronic kidney disease, stage 3 (moderate): Secondary | ICD-10-CM | POA: Diagnosis not present

## 2017-01-13 DIAGNOSIS — J90 Pleural effusion, not elsewhere classified: Secondary | ICD-10-CM | POA: Diagnosis not present

## 2017-01-13 DIAGNOSIS — G4730 Sleep apnea, unspecified: Secondary | ICD-10-CM | POA: Diagnosis not present

## 2017-01-13 DIAGNOSIS — G4733 Obstructive sleep apnea (adult) (pediatric): Secondary | ICD-10-CM | POA: Diagnosis not present

## 2017-01-13 DIAGNOSIS — M109 Gout, unspecified: Secondary | ICD-10-CM | POA: Diagnosis not present

## 2017-01-13 DIAGNOSIS — N390 Urinary tract infection, site not specified: Secondary | ICD-10-CM | POA: Diagnosis not present

## 2017-01-13 DIAGNOSIS — S81802A Unspecified open wound, left lower leg, initial encounter: Secondary | ICD-10-CM | POA: Diagnosis not present

## 2017-01-13 DIAGNOSIS — I1311 Hypertensive heart and chronic kidney disease without heart failure, with stage 5 chronic kidney disease, or end stage renal disease: Secondary | ICD-10-CM | POA: Diagnosis not present

## 2017-01-13 DIAGNOSIS — R2689 Other abnormalities of gait and mobility: Secondary | ICD-10-CM | POA: Diagnosis not present

## 2017-01-13 DIAGNOSIS — I130 Hypertensive heart and chronic kidney disease with heart failure and stage 1 through stage 4 chronic kidney disease, or unspecified chronic kidney disease: Secondary | ICD-10-CM | POA: Diagnosis not present

## 2017-01-13 DIAGNOSIS — E872 Acidosis: Secondary | ICD-10-CM | POA: Diagnosis not present

## 2017-01-13 DIAGNOSIS — D649 Anemia, unspecified: Secondary | ICD-10-CM | POA: Diagnosis not present

## 2017-01-13 DIAGNOSIS — E1122 Type 2 diabetes mellitus with diabetic chronic kidney disease: Secondary | ICD-10-CM | POA: Diagnosis not present

## 2017-01-13 DIAGNOSIS — N289 Disorder of kidney and ureter, unspecified: Secondary | ICD-10-CM | POA: Diagnosis not present

## 2017-01-13 DIAGNOSIS — R42 Dizziness and giddiness: Secondary | ICD-10-CM | POA: Diagnosis not present

## 2017-01-13 DIAGNOSIS — I69354 Hemiplegia and hemiparesis following cerebral infarction affecting left non-dominant side: Secondary | ICD-10-CM | POA: Diagnosis not present

## 2017-01-13 DIAGNOSIS — R791 Abnormal coagulation profile: Secondary | ICD-10-CM | POA: Diagnosis not present

## 2017-01-13 DIAGNOSIS — N179 Acute kidney failure, unspecified: Secondary | ICD-10-CM | POA: Diagnosis not present

## 2017-01-13 DIAGNOSIS — I129 Hypertensive chronic kidney disease with stage 1 through stage 4 chronic kidney disease, or unspecified chronic kidney disease: Secondary | ICD-10-CM | POA: Diagnosis not present

## 2017-01-13 DIAGNOSIS — N189 Chronic kidney disease, unspecified: Secondary | ICD-10-CM | POA: Diagnosis not present

## 2017-01-13 DIAGNOSIS — R51 Headache: Secondary | ICD-10-CM | POA: Diagnosis not present

## 2017-01-13 DIAGNOSIS — E875 Hyperkalemia: Secondary | ICD-10-CM | POA: Diagnosis not present

## 2017-01-13 DIAGNOSIS — E1129 Type 2 diabetes mellitus with other diabetic kidney complication: Secondary | ICD-10-CM | POA: Diagnosis not present

## 2017-01-13 DIAGNOSIS — I10 Essential (primary) hypertension: Secondary | ICD-10-CM | POA: Diagnosis not present

## 2017-01-13 DIAGNOSIS — I2510 Atherosclerotic heart disease of native coronary artery without angina pectoris: Secondary | ICD-10-CM | POA: Diagnosis not present

## 2017-01-13 DIAGNOSIS — E1165 Type 2 diabetes mellitus with hyperglycemia: Secondary | ICD-10-CM | POA: Diagnosis not present

## 2017-01-13 DIAGNOSIS — S199XXA Unspecified injury of neck, initial encounter: Secondary | ICD-10-CM | POA: Diagnosis not present

## 2017-01-13 DIAGNOSIS — E041 Nontoxic single thyroid nodule: Secondary | ICD-10-CM | POA: Diagnosis not present

## 2017-01-13 DIAGNOSIS — E119 Type 2 diabetes mellitus without complications: Secondary | ICD-10-CM | POA: Diagnosis not present

## 2017-01-13 DIAGNOSIS — M50321 Other cervical disc degeneration at C4-C5 level: Secondary | ICD-10-CM | POA: Diagnosis not present

## 2017-01-13 DIAGNOSIS — W19XXXA Unspecified fall, initial encounter: Secondary | ICD-10-CM | POA: Diagnosis not present

## 2017-01-13 DIAGNOSIS — I4891 Unspecified atrial fibrillation: Secondary | ICD-10-CM | POA: Diagnosis not present

## 2017-01-13 DIAGNOSIS — N184 Chronic kidney disease, stage 4 (severe): Secondary | ICD-10-CM | POA: Diagnosis not present

## 2017-01-13 DIAGNOSIS — B9620 Unspecified Escherichia coli [E. coli] as the cause of diseases classified elsewhere: Secondary | ICD-10-CM | POA: Diagnosis not present

## 2017-01-13 DIAGNOSIS — E162 Hypoglycemia, unspecified: Secondary | ICD-10-CM | POA: Diagnosis not present

## 2017-01-13 DIAGNOSIS — I482 Chronic atrial fibrillation: Secondary | ICD-10-CM | POA: Diagnosis not present

## 2017-01-13 DIAGNOSIS — E118 Type 2 diabetes mellitus with unspecified complications: Secondary | ICD-10-CM | POA: Diagnosis not present

## 2017-01-14 DIAGNOSIS — R51 Headache: Secondary | ICD-10-CM | POA: Diagnosis not present

## 2017-01-14 DIAGNOSIS — M47812 Spondylosis without myelopathy or radiculopathy, cervical region: Secondary | ICD-10-CM | POA: Diagnosis not present

## 2017-01-14 DIAGNOSIS — E041 Nontoxic single thyroid nodule: Secondary | ICD-10-CM | POA: Diagnosis not present

## 2017-01-14 DIAGNOSIS — M50321 Other cervical disc degeneration at C4-C5 level: Secondary | ICD-10-CM | POA: Diagnosis not present

## 2017-01-14 DIAGNOSIS — W19XXXA Unspecified fall, initial encounter: Secondary | ICD-10-CM | POA: Diagnosis not present

## 2017-01-29 DIAGNOSIS — N179 Acute kidney failure, unspecified: Secondary | ICD-10-CM | POA: Diagnosis not present

## 2017-01-29 DIAGNOSIS — E119 Type 2 diabetes mellitus without complications: Secondary | ICD-10-CM | POA: Diagnosis not present

## 2017-01-29 DIAGNOSIS — E875 Hyperkalemia: Secondary | ICD-10-CM | POA: Diagnosis not present

## 2017-01-29 DIAGNOSIS — R58 Hemorrhage, not elsewhere classified: Secondary | ICD-10-CM | POA: Diagnosis not present

## 2017-01-29 DIAGNOSIS — Z794 Long term (current) use of insulin: Secondary | ICD-10-CM | POA: Diagnosis not present

## 2017-01-29 DIAGNOSIS — I69354 Hemiplegia and hemiparesis following cerebral infarction affecting left non-dominant side: Secondary | ICD-10-CM | POA: Diagnosis not present

## 2017-01-29 DIAGNOSIS — S37009A Unspecified injury of unspecified kidney, initial encounter: Secondary | ICD-10-CM | POA: Diagnosis not present

## 2017-01-29 DIAGNOSIS — I252 Old myocardial infarction: Secondary | ICD-10-CM | POA: Diagnosis not present

## 2017-01-29 DIAGNOSIS — N184 Chronic kidney disease, stage 4 (severe): Secondary | ICD-10-CM | POA: Diagnosis not present

## 2017-01-29 DIAGNOSIS — I132 Hypertensive heart and chronic kidney disease with heart failure and with stage 5 chronic kidney disease, or end stage renal disease: Secondary | ICD-10-CM | POA: Diagnosis not present

## 2017-01-29 DIAGNOSIS — R791 Abnormal coagulation profile: Secondary | ICD-10-CM | POA: Diagnosis not present

## 2017-01-29 DIAGNOSIS — E118 Type 2 diabetes mellitus with unspecified complications: Secondary | ICD-10-CM | POA: Diagnosis not present

## 2017-01-29 DIAGNOSIS — N186 End stage renal disease: Secondary | ICD-10-CM | POA: Diagnosis not present

## 2017-01-29 DIAGNOSIS — I25119 Atherosclerotic heart disease of native coronary artery with unspecified angina pectoris: Secondary | ICD-10-CM | POA: Diagnosis not present

## 2017-01-29 DIAGNOSIS — I5042 Chronic combined systolic (congestive) and diastolic (congestive) heart failure: Secondary | ICD-10-CM | POA: Diagnosis not present

## 2017-01-29 DIAGNOSIS — T45515A Adverse effect of anticoagulants, initial encounter: Secondary | ICD-10-CM | POA: Diagnosis not present

## 2017-01-29 DIAGNOSIS — R2689 Other abnormalities of gait and mobility: Secondary | ICD-10-CM | POA: Diagnosis not present

## 2017-01-29 DIAGNOSIS — Z8673 Personal history of transient ischemic attack (TIA), and cerebral infarction without residual deficits: Secondary | ICD-10-CM | POA: Diagnosis not present

## 2017-01-29 DIAGNOSIS — Z751 Person awaiting admission to adequate facility elsewhere: Secondary | ICD-10-CM | POA: Diagnosis not present

## 2017-01-29 DIAGNOSIS — I4891 Unspecified atrial fibrillation: Secondary | ICD-10-CM | POA: Diagnosis not present

## 2017-01-29 DIAGNOSIS — I517 Cardiomegaly: Secondary | ICD-10-CM | POA: Diagnosis not present

## 2017-01-29 DIAGNOSIS — I480 Paroxysmal atrial fibrillation: Secondary | ICD-10-CM | POA: Diagnosis not present

## 2017-01-29 DIAGNOSIS — D500 Iron deficiency anemia secondary to blood loss (chronic): Secondary | ICD-10-CM | POA: Diagnosis not present

## 2017-01-29 DIAGNOSIS — K921 Melena: Secondary | ICD-10-CM | POA: Diagnosis not present

## 2017-01-29 DIAGNOSIS — K2970 Gastritis, unspecified, without bleeding: Secondary | ICD-10-CM | POA: Diagnosis not present

## 2017-01-29 DIAGNOSIS — J90 Pleural effusion, not elsewhere classified: Secondary | ICD-10-CM | POA: Diagnosis not present

## 2017-01-29 DIAGNOSIS — E871 Hypo-osmolality and hyponatremia: Secondary | ICD-10-CM | POA: Diagnosis not present

## 2017-01-29 DIAGNOSIS — E785 Hyperlipidemia, unspecified: Secondary | ICD-10-CM | POA: Diagnosis not present

## 2017-01-29 DIAGNOSIS — M6281 Muscle weakness (generalized): Secondary | ICD-10-CM | POA: Diagnosis not present

## 2017-01-29 DIAGNOSIS — R9439 Abnormal result of other cardiovascular function study: Secondary | ICD-10-CM | POA: Diagnosis not present

## 2017-01-29 DIAGNOSIS — R279 Unspecified lack of coordination: Secondary | ICD-10-CM | POA: Diagnosis not present

## 2017-01-29 DIAGNOSIS — M109 Gout, unspecified: Secondary | ICD-10-CM | POA: Diagnosis not present

## 2017-01-29 DIAGNOSIS — K922 Gastrointestinal hemorrhage, unspecified: Secondary | ICD-10-CM | POA: Diagnosis not present

## 2017-01-29 DIAGNOSIS — N189 Chronic kidney disease, unspecified: Secondary | ICD-10-CM | POA: Diagnosis not present

## 2017-01-29 DIAGNOSIS — K264 Chronic or unspecified duodenal ulcer with hemorrhage: Secondary | ICD-10-CM | POA: Diagnosis not present

## 2017-01-29 DIAGNOSIS — I2510 Atherosclerotic heart disease of native coronary artery without angina pectoris: Secondary | ICD-10-CM | POA: Diagnosis not present

## 2017-01-29 DIAGNOSIS — G4730 Sleep apnea, unspecified: Secondary | ICD-10-CM | POA: Diagnosis not present

## 2017-01-29 DIAGNOSIS — I482 Chronic atrial fibrillation: Secondary | ICD-10-CM | POA: Diagnosis not present

## 2017-01-29 DIAGNOSIS — I10 Essential (primary) hypertension: Secondary | ICD-10-CM | POA: Diagnosis not present

## 2017-01-29 DIAGNOSIS — Z7401 Bed confinement status: Secondary | ICD-10-CM | POA: Diagnosis not present

## 2017-01-29 DIAGNOSIS — D6832 Hemorrhagic disorder due to extrinsic circulating anticoagulants: Secondary | ICD-10-CM | POA: Diagnosis not present

## 2017-01-29 DIAGNOSIS — I509 Heart failure, unspecified: Secondary | ICD-10-CM | POA: Diagnosis not present

## 2017-01-29 DIAGNOSIS — R0600 Dyspnea, unspecified: Secondary | ICD-10-CM | POA: Diagnosis not present

## 2017-01-29 DIAGNOSIS — D62 Acute posthemorrhagic anemia: Secondary | ICD-10-CM | POA: Diagnosis not present

## 2017-01-29 DIAGNOSIS — R296 Repeated falls: Secondary | ICD-10-CM | POA: Diagnosis not present

## 2017-01-29 DIAGNOSIS — E1129 Type 2 diabetes mellitus with other diabetic kidney complication: Secondary | ICD-10-CM | POA: Diagnosis not present

## 2017-01-29 DIAGNOSIS — E162 Hypoglycemia, unspecified: Secondary | ICD-10-CM | POA: Diagnosis not present

## 2017-01-29 DIAGNOSIS — I472 Ventricular tachycardia: Secondary | ICD-10-CM | POA: Diagnosis not present

## 2017-01-29 DIAGNOSIS — N2581 Secondary hyperparathyroidism of renal origin: Secondary | ICD-10-CM | POA: Diagnosis not present

## 2017-01-29 DIAGNOSIS — Z7901 Long term (current) use of anticoagulants: Secondary | ICD-10-CM | POA: Diagnosis not present

## 2017-03-05 DIAGNOSIS — D500 Iron deficiency anemia secondary to blood loss (chronic): Secondary | ICD-10-CM | POA: Diagnosis not present

## 2017-03-05 DIAGNOSIS — R079 Chest pain, unspecified: Secondary | ICD-10-CM | POA: Diagnosis not present

## 2017-03-05 DIAGNOSIS — D62 Acute posthemorrhagic anemia: Secondary | ICD-10-CM | POA: Diagnosis not present

## 2017-03-05 DIAGNOSIS — D631 Anemia in chronic kidney disease: Secondary | ICD-10-CM | POA: Diagnosis not present

## 2017-03-05 DIAGNOSIS — I480 Paroxysmal atrial fibrillation: Secondary | ICD-10-CM | POA: Diagnosis not present

## 2017-03-05 DIAGNOSIS — R0600 Dyspnea, unspecified: Secondary | ICD-10-CM | POA: Diagnosis not present

## 2017-03-05 DIAGNOSIS — E871 Hypo-osmolality and hyponatremia: Secondary | ICD-10-CM | POA: Diagnosis not present

## 2017-03-05 DIAGNOSIS — I4891 Unspecified atrial fibrillation: Secondary | ICD-10-CM | POA: Diagnosis not present

## 2017-03-05 DIAGNOSIS — I82611 Acute embolism and thrombosis of superficial veins of right upper extremity: Secondary | ICD-10-CM | POA: Diagnosis not present

## 2017-03-05 DIAGNOSIS — E875 Hyperkalemia: Secondary | ICD-10-CM | POA: Diagnosis not present

## 2017-03-05 DIAGNOSIS — R749 Abnormal serum enzyme level, unspecified: Secondary | ICD-10-CM | POA: Diagnosis not present

## 2017-03-05 DIAGNOSIS — T45515A Adverse effect of anticoagulants, initial encounter: Secondary | ICD-10-CM | POA: Diagnosis not present

## 2017-03-05 DIAGNOSIS — I132 Hypertensive heart and chronic kidney disease with heart failure and with stage 5 chronic kidney disease, or end stage renal disease: Secondary | ICD-10-CM | POA: Diagnosis not present

## 2017-03-05 DIAGNOSIS — K8020 Calculus of gallbladder without cholecystitis without obstruction: Secondary | ICD-10-CM | POA: Diagnosis not present

## 2017-03-05 DIAGNOSIS — N19 Unspecified kidney failure: Secondary | ICD-10-CM | POA: Diagnosis not present

## 2017-03-05 DIAGNOSIS — I5022 Chronic systolic (congestive) heart failure: Secondary | ICD-10-CM | POA: Diagnosis not present

## 2017-03-05 DIAGNOSIS — J9612 Chronic respiratory failure with hypercapnia: Secondary | ICD-10-CM | POA: Diagnosis not present

## 2017-03-05 DIAGNOSIS — R188 Other ascites: Secondary | ICD-10-CM | POA: Diagnosis not present

## 2017-03-05 DIAGNOSIS — I2510 Atherosclerotic heart disease of native coronary artery without angina pectoris: Secondary | ICD-10-CM | POA: Diagnosis not present

## 2017-03-05 DIAGNOSIS — K269 Duodenal ulcer, unspecified as acute or chronic, without hemorrhage or perforation: Secondary | ICD-10-CM | POA: Diagnosis not present

## 2017-03-05 DIAGNOSIS — I509 Heart failure, unspecified: Secondary | ICD-10-CM | POA: Diagnosis not present

## 2017-03-05 DIAGNOSIS — I472 Ventricular tachycardia: Secondary | ICD-10-CM | POA: Diagnosis not present

## 2017-03-05 DIAGNOSIS — S37009A Unspecified injury of unspecified kidney, initial encounter: Secondary | ICD-10-CM | POA: Diagnosis not present

## 2017-03-05 DIAGNOSIS — J984 Other disorders of lung: Secondary | ICD-10-CM | POA: Diagnosis not present

## 2017-03-05 DIAGNOSIS — Z794 Long term (current) use of insulin: Secondary | ICD-10-CM | POA: Diagnosis not present

## 2017-03-05 DIAGNOSIS — I517 Cardiomegaly: Secondary | ICD-10-CM | POA: Diagnosis not present

## 2017-03-05 DIAGNOSIS — K2971 Gastritis, unspecified, with bleeding: Secondary | ICD-10-CM | POA: Diagnosis not present

## 2017-03-05 DIAGNOSIS — K2930 Chronic superficial gastritis without bleeding: Secondary | ICD-10-CM | POA: Diagnosis not present

## 2017-03-05 DIAGNOSIS — E1165 Type 2 diabetes mellitus with hyperglycemia: Secondary | ICD-10-CM | POA: Diagnosis not present

## 2017-03-05 DIAGNOSIS — R0602 Shortness of breath: Secondary | ICD-10-CM | POA: Diagnosis not present

## 2017-03-05 DIAGNOSIS — E876 Hypokalemia: Secondary | ICD-10-CM | POA: Diagnosis not present

## 2017-03-05 DIAGNOSIS — Z7901 Long term (current) use of anticoagulants: Secondary | ICD-10-CM | POA: Diagnosis not present

## 2017-03-05 DIAGNOSIS — J9611 Chronic respiratory failure with hypoxia: Secondary | ICD-10-CM | POA: Diagnosis not present

## 2017-03-05 DIAGNOSIS — I5042 Chronic combined systolic (congestive) and diastolic (congestive) heart failure: Secondary | ICD-10-CM | POA: Diagnosis not present

## 2017-03-05 DIAGNOSIS — Z7401 Bed confinement status: Secondary | ICD-10-CM | POA: Diagnosis not present

## 2017-03-05 DIAGNOSIS — I25119 Atherosclerotic heart disease of native coronary artery with unspecified angina pectoris: Secondary | ICD-10-CM | POA: Diagnosis not present

## 2017-03-05 DIAGNOSIS — N179 Acute kidney failure, unspecified: Secondary | ICD-10-CM | POA: Diagnosis not present

## 2017-03-05 DIAGNOSIS — Z751 Person awaiting admission to adequate facility elsewhere: Secondary | ICD-10-CM | POA: Diagnosis not present

## 2017-03-05 DIAGNOSIS — I10 Essential (primary) hypertension: Secondary | ICD-10-CM | POA: Diagnosis not present

## 2017-03-05 DIAGNOSIS — D649 Anemia, unspecified: Secondary | ICD-10-CM | POA: Diagnosis not present

## 2017-03-05 DIAGNOSIS — I272 Pulmonary hypertension, unspecified: Secondary | ICD-10-CM | POA: Diagnosis not present

## 2017-03-05 DIAGNOSIS — I69354 Hemiplegia and hemiparesis following cerebral infarction affecting left non-dominant side: Secondary | ICD-10-CM | POA: Diagnosis not present

## 2017-03-05 DIAGNOSIS — R791 Abnormal coagulation profile: Secondary | ICD-10-CM | POA: Diagnosis not present

## 2017-03-05 DIAGNOSIS — I482 Chronic atrial fibrillation: Secondary | ICD-10-CM | POA: Diagnosis not present

## 2017-03-05 DIAGNOSIS — M6281 Muscle weakness (generalized): Secondary | ICD-10-CM | POA: Diagnosis not present

## 2017-03-05 DIAGNOSIS — G4733 Obstructive sleep apnea (adult) (pediatric): Secondary | ICD-10-CM | POA: Diagnosis not present

## 2017-03-05 DIAGNOSIS — R9439 Abnormal result of other cardiovascular function study: Secondary | ICD-10-CM | POA: Diagnosis not present

## 2017-03-05 DIAGNOSIS — N186 End stage renal disease: Secondary | ICD-10-CM | POA: Diagnosis not present

## 2017-03-05 DIAGNOSIS — K921 Melena: Secondary | ICD-10-CM | POA: Diagnosis not present

## 2017-03-05 DIAGNOSIS — E119 Type 2 diabetes mellitus without complications: Secondary | ICD-10-CM | POA: Diagnosis not present

## 2017-03-05 DIAGNOSIS — J90 Pleural effusion, not elsewhere classified: Secondary | ICD-10-CM | POA: Diagnosis not present

## 2017-03-05 DIAGNOSIS — D6832 Hemorrhagic disorder due to extrinsic circulating anticoagulants: Secondary | ICD-10-CM | POA: Diagnosis not present

## 2017-03-05 DIAGNOSIS — R279 Unspecified lack of coordination: Secondary | ICD-10-CM | POA: Diagnosis not present

## 2017-03-05 DIAGNOSIS — I313 Pericardial effusion (noninflammatory): Secondary | ICD-10-CM | POA: Diagnosis not present

## 2017-03-05 DIAGNOSIS — N2581 Secondary hyperparathyroidism of renal origin: Secondary | ICD-10-CM | POA: Diagnosis not present

## 2017-03-05 DIAGNOSIS — I252 Old myocardial infarction: Secondary | ICD-10-CM | POA: Diagnosis not present

## 2017-03-05 DIAGNOSIS — K922 Gastrointestinal hemorrhage, unspecified: Secondary | ICD-10-CM | POA: Diagnosis not present

## 2017-03-05 DIAGNOSIS — K264 Chronic or unspecified duodenal ulcer with hemorrhage: Secondary | ICD-10-CM | POA: Diagnosis not present

## 2017-03-05 DIAGNOSIS — R58 Hemorrhage, not elsewhere classified: Secondary | ICD-10-CM | POA: Diagnosis not present

## 2017-03-05 DIAGNOSIS — I081 Rheumatic disorders of both mitral and tricuspid valves: Secondary | ICD-10-CM | POA: Diagnosis not present

## 2017-03-05 DIAGNOSIS — K2970 Gastritis, unspecified, without bleeding: Secondary | ICD-10-CM | POA: Diagnosis not present

## 2017-03-05 DIAGNOSIS — E1129 Type 2 diabetes mellitus with other diabetic kidney complication: Secondary | ICD-10-CM | POA: Diagnosis not present

## 2017-03-10 DIAGNOSIS — K2930 Chronic superficial gastritis without bleeding: Secondary | ICD-10-CM | POA: Diagnosis not present

## 2017-03-25 DIAGNOSIS — N19 Unspecified kidney failure: Secondary | ICD-10-CM | POA: Diagnosis not present

## 2017-03-29 DIAGNOSIS — N186 End stage renal disease: Secondary | ICD-10-CM | POA: Diagnosis not present

## 2017-04-21 DIAGNOSIS — G319 Degenerative disease of nervous system, unspecified: Secondary | ICD-10-CM | POA: Diagnosis not present

## 2017-04-21 DIAGNOSIS — E1122 Type 2 diabetes mellitus with diabetic chronic kidney disease: Secondary | ICD-10-CM | POA: Diagnosis not present

## 2017-04-21 DIAGNOSIS — E1165 Type 2 diabetes mellitus with hyperglycemia: Secondary | ICD-10-CM | POA: Diagnosis not present

## 2017-04-21 DIAGNOSIS — I120 Hypertensive chronic kidney disease with stage 5 chronic kidney disease or end stage renal disease: Secondary | ICD-10-CM | POA: Diagnosis not present

## 2017-04-21 DIAGNOSIS — K269 Duodenal ulcer, unspecified as acute or chronic, without hemorrhage or perforation: Secondary | ICD-10-CM | POA: Diagnosis not present

## 2017-04-21 DIAGNOSIS — I97131 Postprocedural heart failure following other surgery: Secondary | ICD-10-CM | POA: Diagnosis not present

## 2017-04-21 DIAGNOSIS — I4891 Unspecified atrial fibrillation: Secondary | ICD-10-CM | POA: Diagnosis not present

## 2017-04-21 DIAGNOSIS — R531 Weakness: Secondary | ICD-10-CM | POA: Diagnosis not present

## 2017-04-21 DIAGNOSIS — I482 Chronic atrial fibrillation: Secondary | ICD-10-CM | POA: Diagnosis not present

## 2017-04-21 DIAGNOSIS — G8918 Other acute postprocedural pain: Secondary | ICD-10-CM | POA: Diagnosis not present

## 2017-04-21 DIAGNOSIS — I132 Hypertensive heart and chronic kidney disease with heart failure and with stage 5 chronic kidney disease, or end stage renal disease: Secondary | ICD-10-CM | POA: Diagnosis not present

## 2017-04-21 DIAGNOSIS — K2970 Gastritis, unspecified, without bleeding: Secondary | ICD-10-CM | POA: Diagnosis not present

## 2017-04-21 DIAGNOSIS — Z9989 Dependence on other enabling machines and devices: Secondary | ICD-10-CM | POA: Diagnosis not present

## 2017-04-21 DIAGNOSIS — R42 Dizziness and giddiness: Secondary | ICD-10-CM | POA: Diagnosis not present

## 2017-04-21 DIAGNOSIS — J9612 Chronic respiratory failure with hypercapnia: Secondary | ICD-10-CM | POA: Diagnosis not present

## 2017-04-21 DIAGNOSIS — E785 Hyperlipidemia, unspecified: Secondary | ICD-10-CM | POA: Diagnosis not present

## 2017-04-21 DIAGNOSIS — D649 Anemia, unspecified: Secondary | ICD-10-CM | POA: Diagnosis not present

## 2017-04-21 DIAGNOSIS — Z9115 Patient's noncompliance with renal dialysis: Secondary | ICD-10-CM | POA: Diagnosis not present

## 2017-04-21 DIAGNOSIS — I2510 Atherosclerotic heart disease of native coronary artery without angina pectoris: Secondary | ICD-10-CM | POA: Diagnosis not present

## 2017-04-21 DIAGNOSIS — N186 End stage renal disease: Secondary | ICD-10-CM | POA: Diagnosis not present

## 2017-04-21 DIAGNOSIS — I447 Left bundle-branch block, unspecified: Secondary | ICD-10-CM | POA: Diagnosis not present

## 2017-04-21 DIAGNOSIS — G4733 Obstructive sleep apnea (adult) (pediatric): Secondary | ICD-10-CM | POA: Diagnosis not present

## 2017-04-21 DIAGNOSIS — E871 Hypo-osmolality and hyponatremia: Secondary | ICD-10-CM | POA: Diagnosis not present

## 2017-04-21 DIAGNOSIS — J9611 Chronic respiratory failure with hypoxia: Secondary | ICD-10-CM | POA: Diagnosis not present

## 2017-04-21 DIAGNOSIS — R064 Hyperventilation: Secondary | ICD-10-CM | POA: Diagnosis not present

## 2017-04-21 DIAGNOSIS — I5022 Chronic systolic (congestive) heart failure: Secondary | ICD-10-CM | POA: Diagnosis not present

## 2017-04-21 DIAGNOSIS — I509 Heart failure, unspecified: Secondary | ICD-10-CM | POA: Diagnosis not present

## 2017-04-21 DIAGNOSIS — I472 Ventricular tachycardia: Secondary | ICD-10-CM | POA: Diagnosis not present

## 2017-04-21 DIAGNOSIS — Z452 Encounter for adjustment and management of vascular access device: Secondary | ICD-10-CM | POA: Diagnosis not present

## 2017-04-21 DIAGNOSIS — Z992 Dependence on renal dialysis: Secondary | ICD-10-CM | POA: Diagnosis not present

## 2017-04-21 DIAGNOSIS — I5020 Unspecified systolic (congestive) heart failure: Secondary | ICD-10-CM | POA: Diagnosis not present

## 2017-04-21 DIAGNOSIS — I5032 Chronic diastolic (congestive) heart failure: Secondary | ICD-10-CM | POA: Diagnosis not present

## 2017-04-21 DIAGNOSIS — I517 Cardiomegaly: Secondary | ICD-10-CM | POA: Diagnosis not present

## 2017-04-21 DIAGNOSIS — M79601 Pain in right arm: Secondary | ICD-10-CM | POA: Diagnosis not present

## 2017-04-21 DIAGNOSIS — N2581 Secondary hyperparathyroidism of renal origin: Secondary | ICD-10-CM | POA: Diagnosis not present

## 2017-04-21 DIAGNOSIS — D631 Anemia in chronic kidney disease: Secondary | ICD-10-CM | POA: Diagnosis not present

## 2017-04-21 DIAGNOSIS — I481 Persistent atrial fibrillation: Secondary | ICD-10-CM | POA: Diagnosis not present

## 2017-04-21 DIAGNOSIS — E872 Acidosis: Secondary | ICD-10-CM | POA: Diagnosis not present

## 2017-04-21 DIAGNOSIS — E119 Type 2 diabetes mellitus without complications: Secondary | ICD-10-CM | POA: Diagnosis not present

## 2017-05-29 DIAGNOSIS — Z992 Dependence on renal dialysis: Secondary | ICD-10-CM | POA: Diagnosis not present

## 2017-05-29 DIAGNOSIS — N186 End stage renal disease: Secondary | ICD-10-CM | POA: Diagnosis not present

## 2017-06-01 DIAGNOSIS — I509 Heart failure, unspecified: Secondary | ICD-10-CM | POA: Diagnosis not present

## 2017-06-01 DIAGNOSIS — I110 Hypertensive heart disease with heart failure: Secondary | ICD-10-CM | POA: Diagnosis not present

## 2017-06-01 DIAGNOSIS — M25562 Pain in left knee: Secondary | ICD-10-CM | POA: Diagnosis not present

## 2017-06-01 DIAGNOSIS — Z8673 Personal history of transient ischemic attack (TIA), and cerebral infarction without residual deficits: Secondary | ICD-10-CM | POA: Diagnosis not present

## 2017-06-01 DIAGNOSIS — M25561 Pain in right knee: Secondary | ICD-10-CM | POA: Diagnosis not present

## 2017-06-01 DIAGNOSIS — Z7901 Long term (current) use of anticoagulants: Secondary | ICD-10-CM | POA: Diagnosis not present

## 2017-06-01 DIAGNOSIS — N3000 Acute cystitis without hematuria: Secondary | ICD-10-CM | POA: Diagnosis not present

## 2017-06-01 DIAGNOSIS — W1839XA Other fall on same level, initial encounter: Secondary | ICD-10-CM | POA: Diagnosis not present

## 2017-06-01 DIAGNOSIS — N186 End stage renal disease: Secondary | ICD-10-CM | POA: Diagnosis not present

## 2017-06-01 DIAGNOSIS — I120 Hypertensive chronic kidney disease with stage 5 chronic kidney disease or end stage renal disease: Secondary | ICD-10-CM | POA: Diagnosis not present

## 2017-06-01 DIAGNOSIS — R0602 Shortness of breath: Secondary | ICD-10-CM | POA: Diagnosis not present

## 2017-06-01 DIAGNOSIS — R531 Weakness: Secondary | ICD-10-CM | POA: Diagnosis not present

## 2017-06-02 DIAGNOSIS — I482 Chronic atrial fibrillation: Secondary | ICD-10-CM | POA: Diagnosis not present

## 2017-06-02 DIAGNOSIS — E1169 Type 2 diabetes mellitus with other specified complication: Secondary | ICD-10-CM | POA: Diagnosis not present

## 2017-06-02 DIAGNOSIS — I5042 Chronic combined systolic (congestive) and diastolic (congestive) heart failure: Secondary | ICD-10-CM | POA: Diagnosis not present

## 2017-06-02 DIAGNOSIS — Z992 Dependence on renal dialysis: Secondary | ICD-10-CM | POA: Diagnosis not present

## 2017-06-02 DIAGNOSIS — E119 Type 2 diabetes mellitus without complications: Secondary | ICD-10-CM | POA: Diagnosis not present

## 2017-06-02 DIAGNOSIS — E782 Mixed hyperlipidemia: Secondary | ICD-10-CM | POA: Diagnosis not present

## 2017-06-02 DIAGNOSIS — G4733 Obstructive sleep apnea (adult) (pediatric): Secondary | ICD-10-CM | POA: Diagnosis not present

## 2017-06-02 DIAGNOSIS — N186 End stage renal disease: Secondary | ICD-10-CM | POA: Diagnosis not present

## 2017-06-03 DIAGNOSIS — Z992 Dependence on renal dialysis: Secondary | ICD-10-CM | POA: Diagnosis not present

## 2017-06-03 DIAGNOSIS — N186 End stage renal disease: Secondary | ICD-10-CM | POA: Diagnosis not present

## 2017-07-15 ENCOUNTER — Ambulatory Visit: Payer: Self-pay

## 2017-10-18 NOTE — Progress Notes (Signed)
Referral Confirmation Phone Call: Called pt. to confirm we received  referral for kidney [facility] evaluation from their doctor. Per pt. he has  a possible living donor(brother).  Electronically entered by: Meyer CoryGaule, Michael on 10/18/2017 12:08:00 PM

## 2017-12-13 NOTE — Progress Notes (Signed)
Placed call to pt at [phone] and [phone] and left messages to  return call regarding the referral. Left writer's name and contact #.  Electronically entered by: Irving Shows on 12/13/2017 10:35:00 AM

## 2017-12-24 NOTE — Progress Notes (Signed)
Pt returned call and completed intake questionnaire. Pt has been turned  down for kidney tx eval appt due to high BMI of 39.6 (H: 6'4/ W: 325 lbs DM  on dialysis). Informed pt of the weight criteria for eval appt  and pt verbally understood. Refer to K. Yeary to review and for turn down  appt letter.  Electronically entered by: Irving ShowsElizarde, Mae on 12/24/2017 3:02:00 PM

## 2017-12-27 NOTE — Progress Notes (Signed)
FU unable to schedule BMI  FU letter in communication module dated 12/27/2017 tasked to [facility] to  distribute to pt/md/dxu    BMI 39.6 with weight 325 and height 6 foot 4 inches  For eval BMI 36 / 295  For listing BMI 345/ 279  Electronically entered by: Wendy PoetYeary, Marylu Dudenhoeffer on 12/27/2017 9:51:00 AM

## 2017-12-31 NOTE — Progress Notes (Signed)
FU letter in communication module dated 12/27/2017 mailed to pt and  Cc:        Khin Verdene Lennert, MD  Claret[facility] Fraise    Electronically entered by: Rowan Blase on 12/31/2017 3:19:00 PM

## 2018-06-11 IMAGING — CR DG CHEST 2V
1 series · 3 of 3 positions shown · non-contrast
Comparison: 04/24/2014

CLINICAL DATA: Shortness of Breath

EXAM:
CHEST  2 VIEW

[Series 1: dg chest 2 view · 0.14mm/px · 3 of 3 slices shown]
[im 1/3]
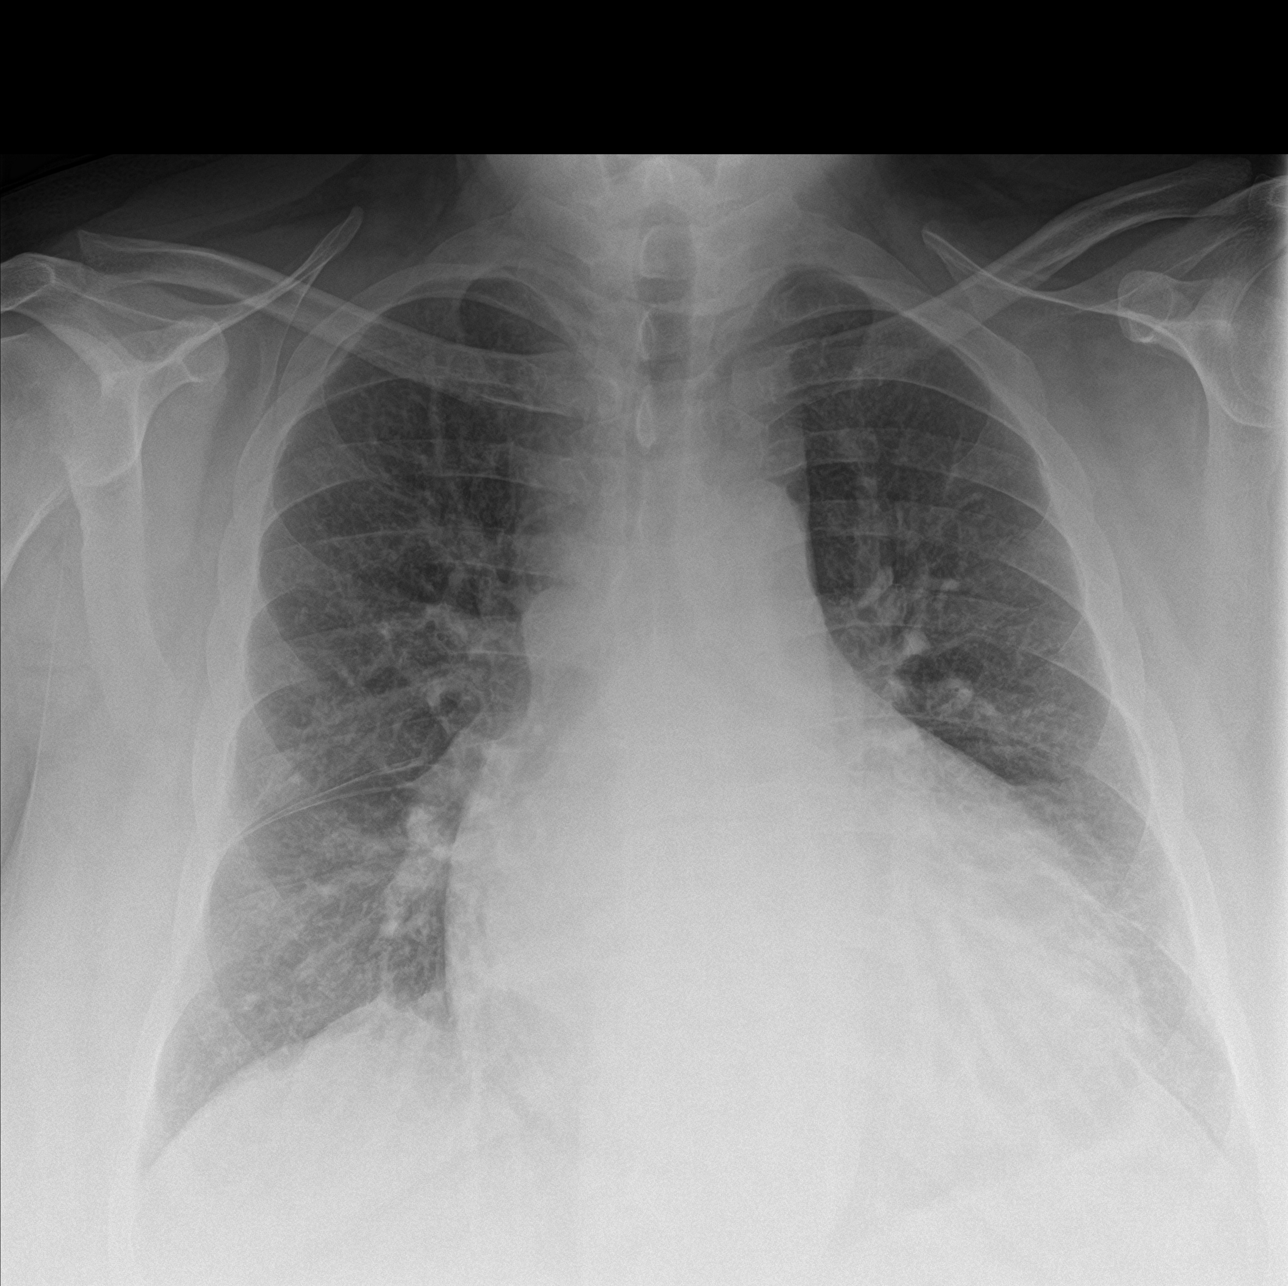
[im 2/3]
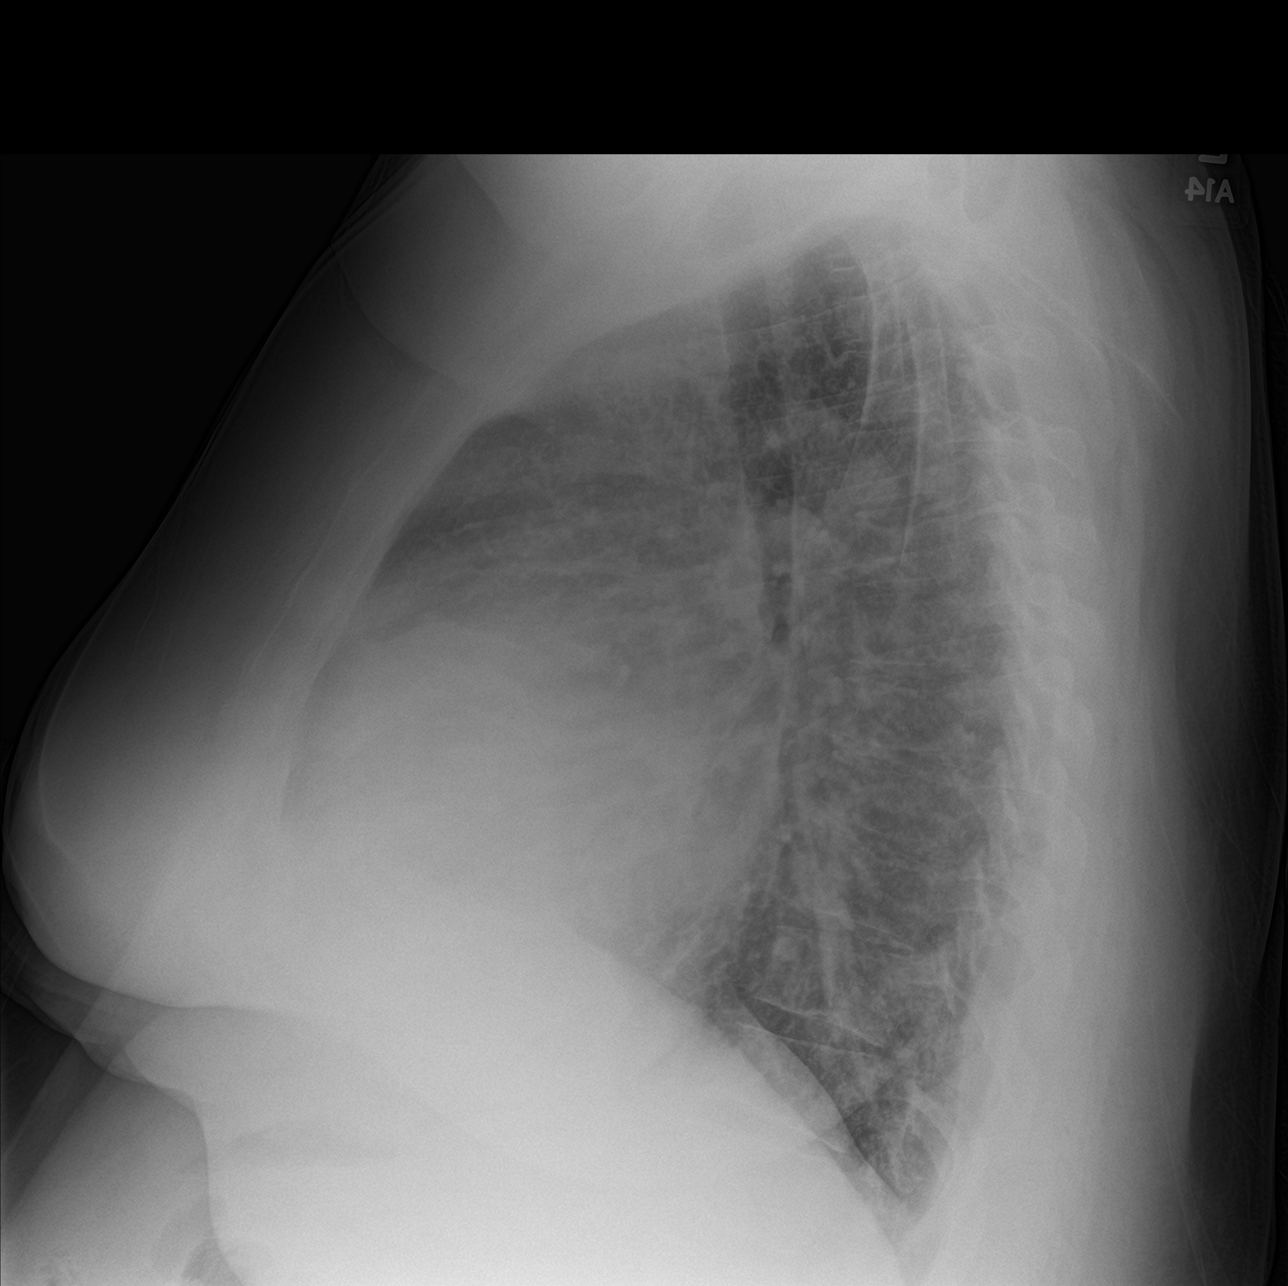
[im 3/3]
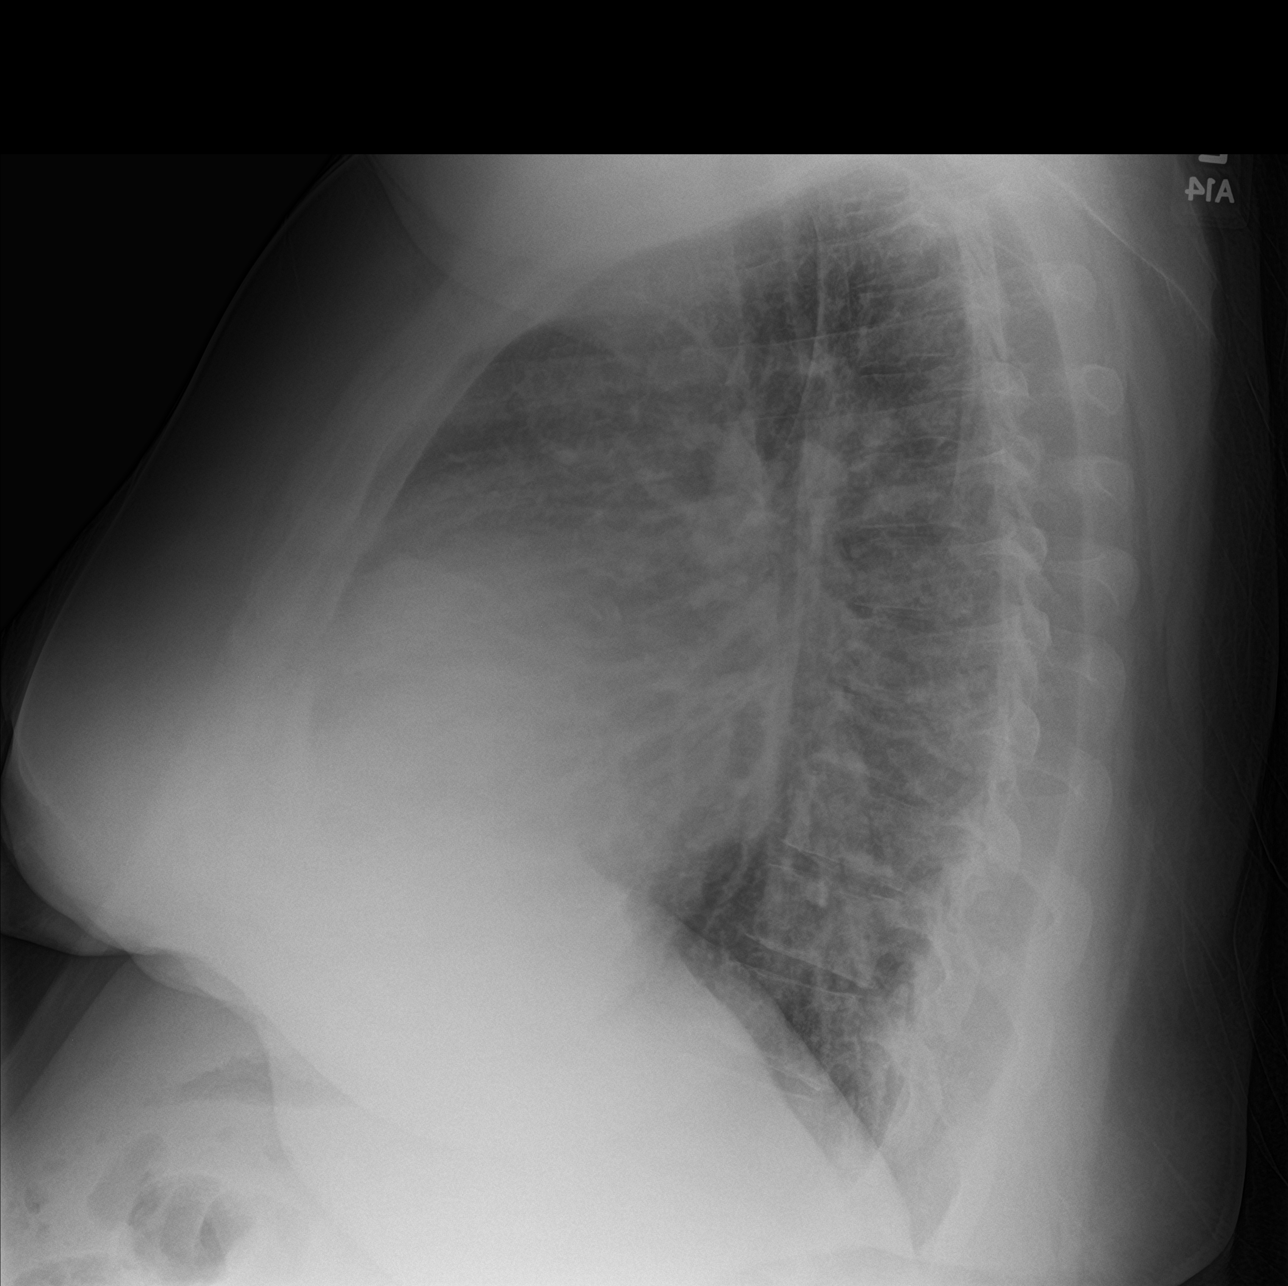

[3 of 3 positions shown; findings below may reference images not displayed]

FINDINGS: The heart is enlarged but appears stable. The mediastinal and hilar
contours are within normal limits unchanged. Mild vascular
congestion with possible mild interstitial edema. No infiltrates or
effusions. The bony thorax is intact.
IMPRESSION: Cardiac enlargement and vascular congestion with possible mild
interstitial edema. No pleural effusions.

## 2018-06-12 IMAGING — CR DG CHEST 2V
1 series · 2 of 2 positions shown · non-contrast
Comparison: August 20, 2016

CLINICAL DATA: Tachycardia.  Congestive heart failure

EXAM:
CHEST  2 VIEW

[Series 1: dg chest 2 view · 0.14mm/px · 2 of 2 slices shown]
[im 1/2]
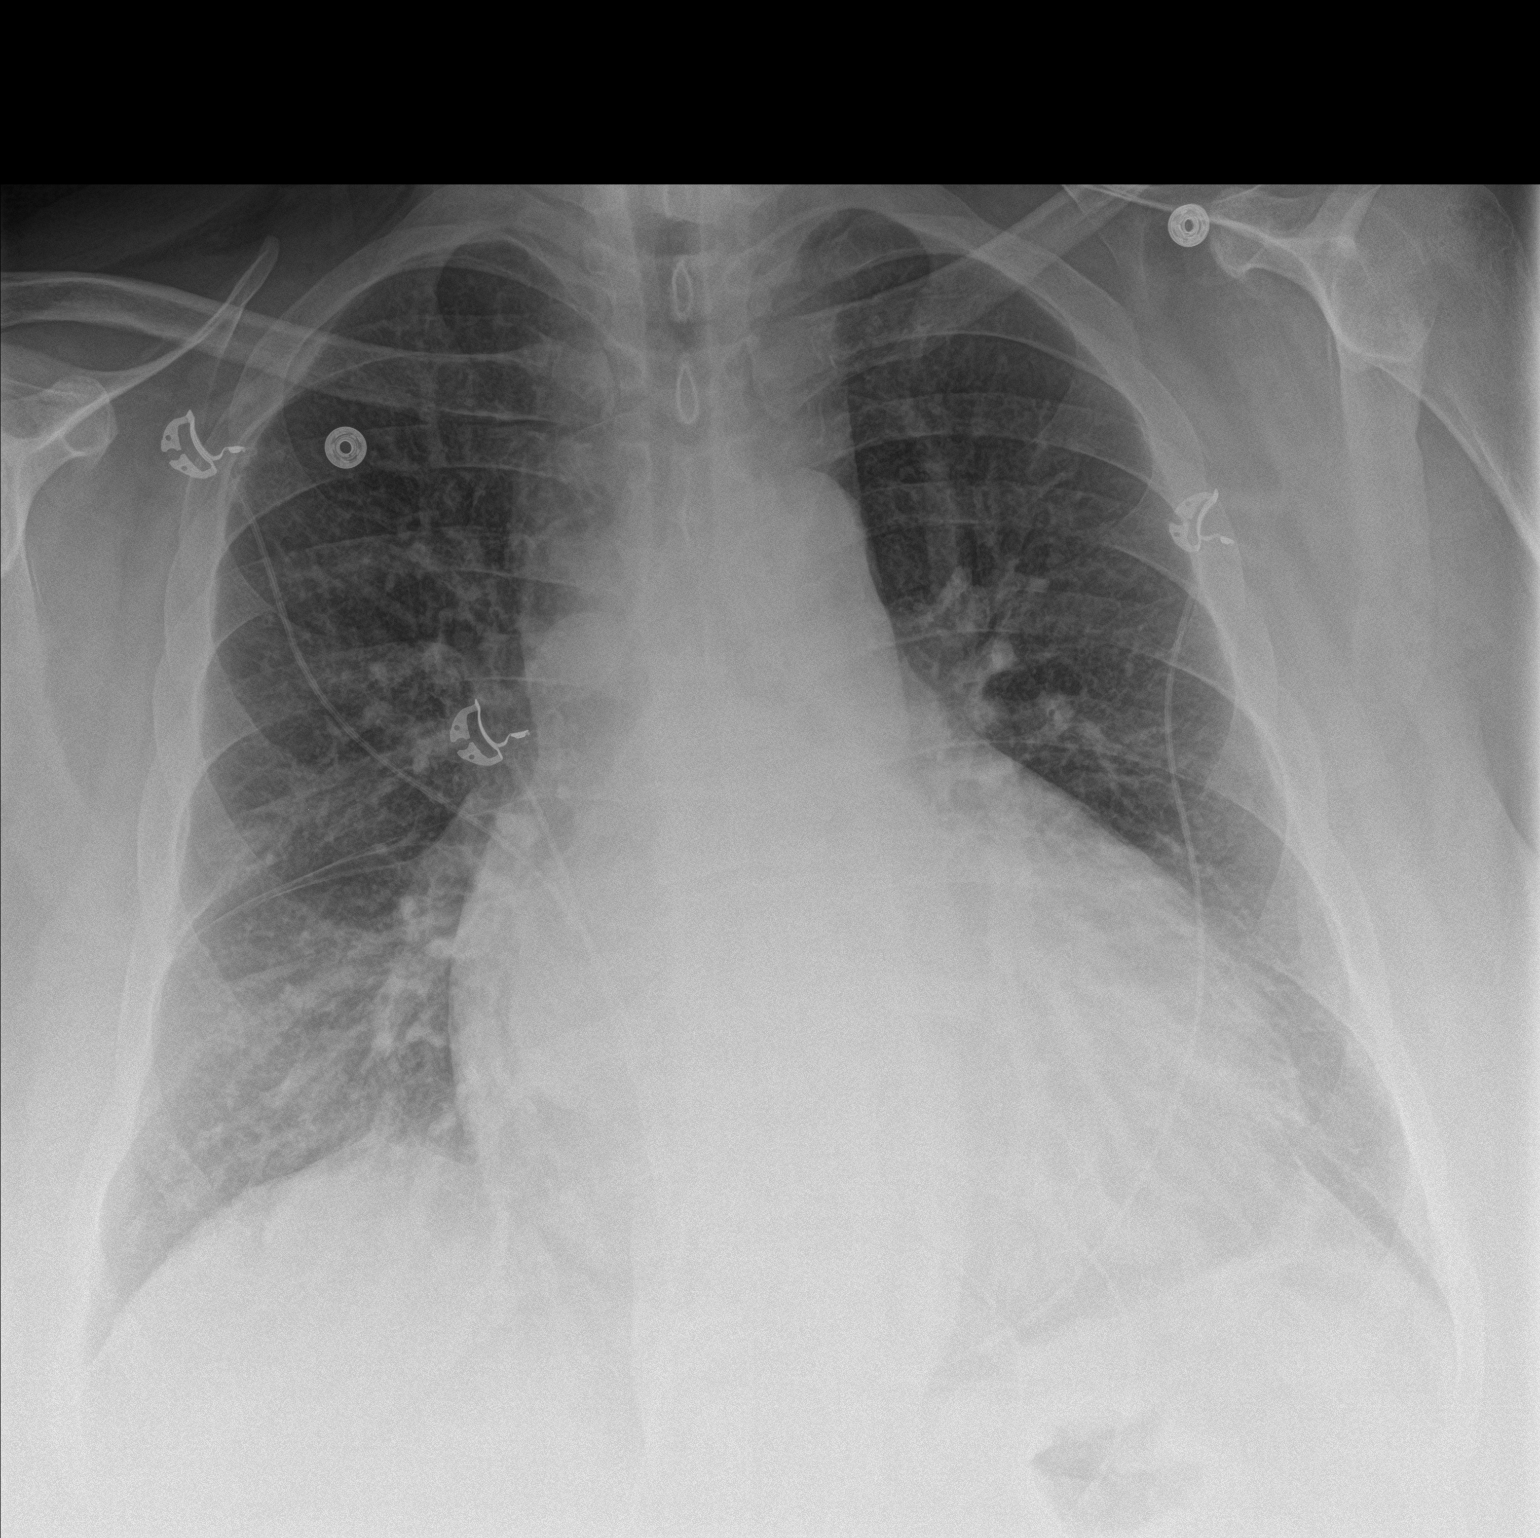
[im 2/2]
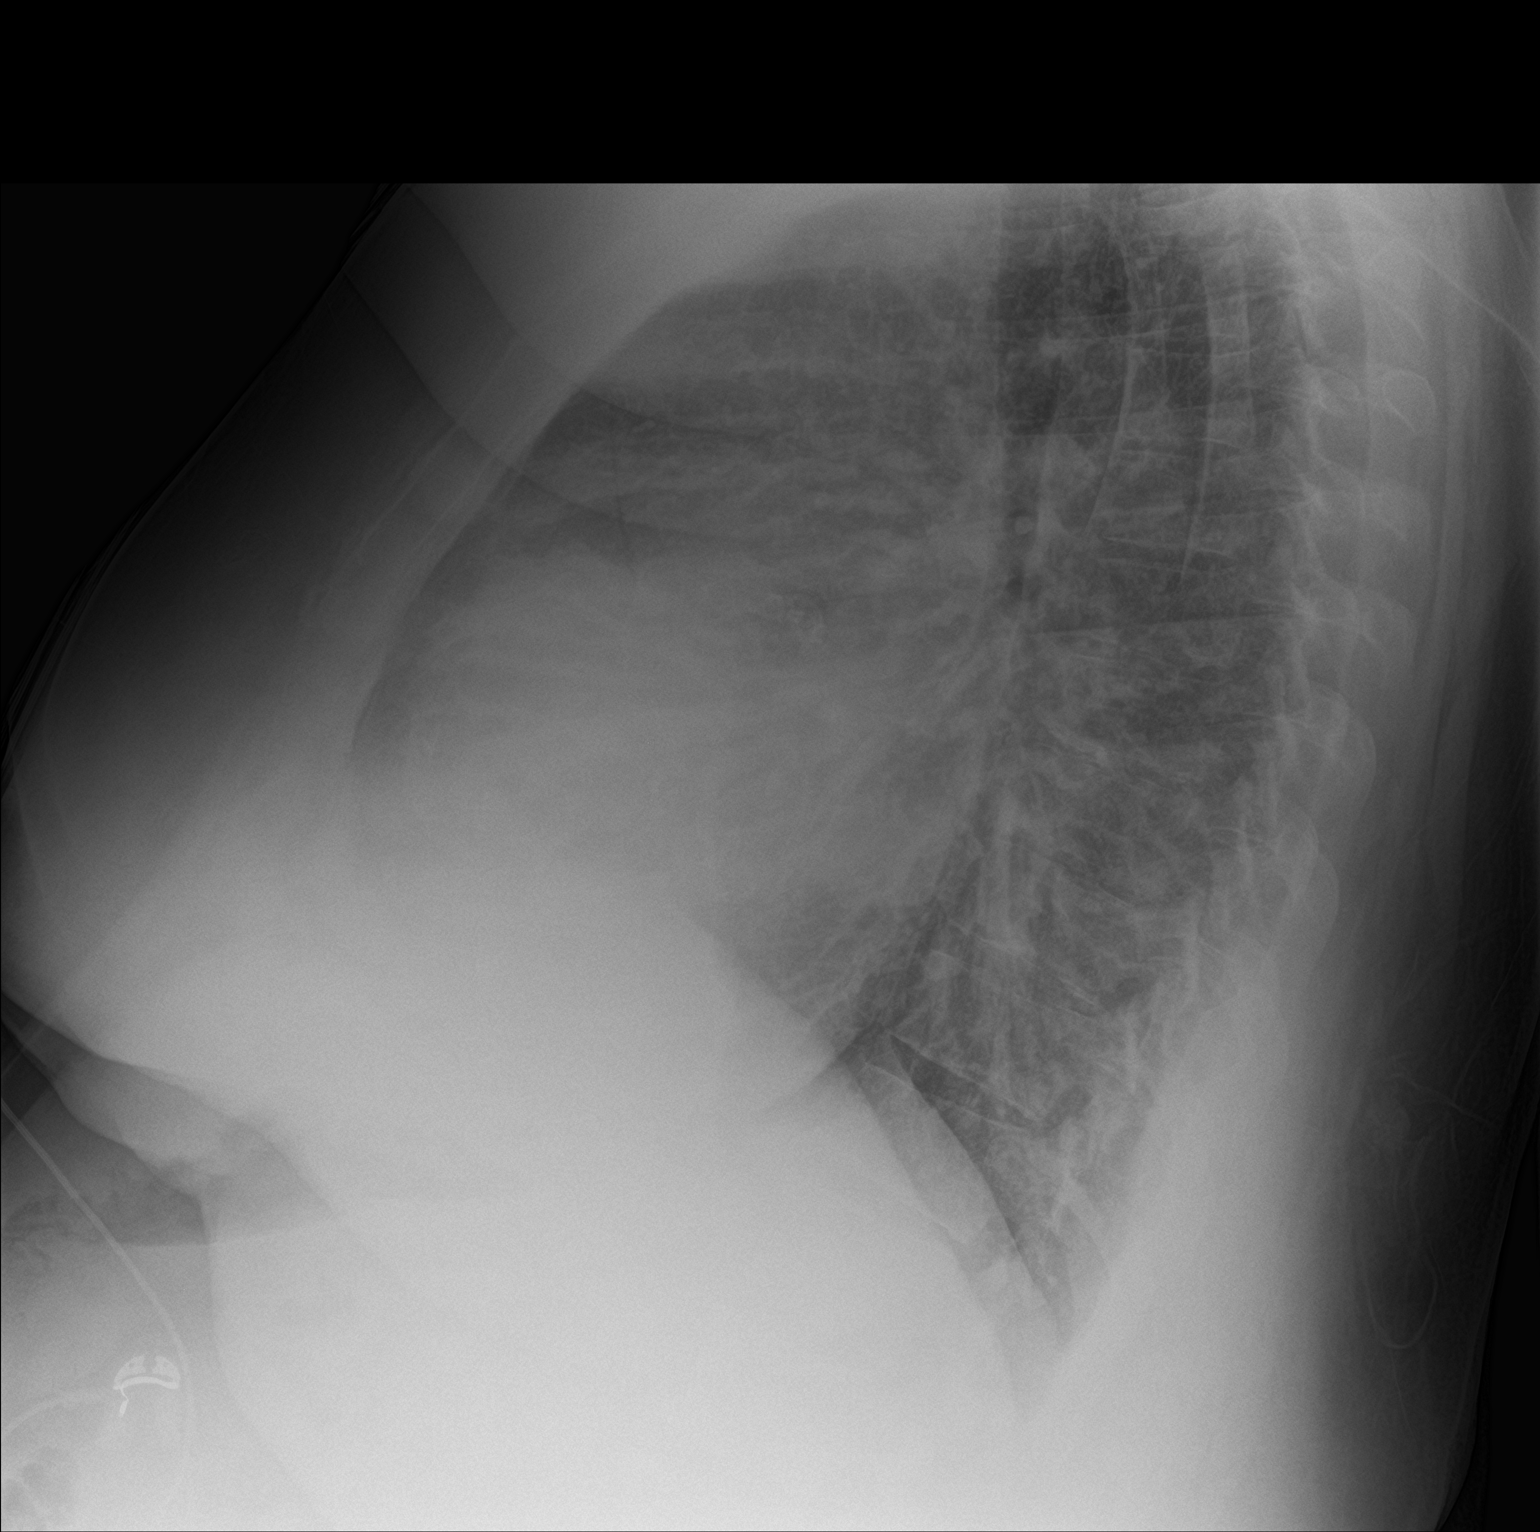

[2 of 2 positions shown; findings below may reference images not displayed]

FINDINGS: There remains cardiomegaly with pulmonary venous hypertension. There
is trace interstitial edema. No airspace consolidation or pleural
effusion. No adenopathy. No bone lesions.
IMPRESSION: Persistent pulmonary vascular congestion with its trace interstitial
edema. Findings indicate a degree of congestive heart failure.
Stable cardiomegaly. No airspace consolidation. Appearance stable
compared to 1 day prior.

## 2019-12-06 DEATH — deceased
